# Patient Record
Sex: Female | Born: 1975 | Race: Black or African American | Hispanic: No | Marital: Single | Smoking: Never smoker
Health system: Southern US, Community
[De-identification: ages and names within clinical notes are randomized; demographics above are authoritative.]

## PROBLEM LIST (undated history)

## (undated) DIAGNOSIS — Z9049 Acquired absence of other specified parts of digestive tract: Secondary | ICD-10-CM

## (undated) DIAGNOSIS — Z9884 Bariatric surgery status: Secondary | ICD-10-CM

## (undated) HISTORY — DX: Acquired absence of other specified parts of digestive tract: Z90.49

## (undated) HISTORY — PX: OTHER SURGICAL HISTORY: SHX169

---

## 2001-05-21 ENCOUNTER — Inpatient Hospital Stay (HOSPITAL_COMMUNITY): Admission: EM | Admit: 2001-05-21 | Discharge: 2001-05-22 | Payer: Self-pay | Admitting: *Deleted

## 2006-08-17 ENCOUNTER — Ambulatory Visit: Payer: Self-pay | Admitting: Family Medicine

## 2007-02-01 ENCOUNTER — Ambulatory Visit: Payer: Self-pay | Admitting: Family Medicine

## 2007-02-01 ENCOUNTER — Other Ambulatory Visit: Admission: RE | Admit: 2007-02-01 | Discharge: 2007-02-01 | Payer: Self-pay | Admitting: Family Medicine

## 2007-02-25 ENCOUNTER — Emergency Department (HOSPITAL_COMMUNITY): Admission: EM | Admit: 2007-02-25 | Discharge: 2007-02-25 | Payer: Self-pay | Admitting: Emergency Medicine

## 2008-12-27 DIAGNOSIS — Z9884 Bariatric surgery status: Secondary | ICD-10-CM

## 2008-12-27 HISTORY — DX: Bariatric surgery status: Z98.84

## 2011-01-26 ENCOUNTER — Other Ambulatory Visit: Payer: Self-pay | Admitting: Orthopaedic Surgery

## 2011-01-26 DIAGNOSIS — M25462 Effusion, left knee: Secondary | ICD-10-CM

## 2011-01-26 DIAGNOSIS — M25562 Pain in left knee: Secondary | ICD-10-CM

## 2011-02-01 ENCOUNTER — Other Ambulatory Visit: Payer: Self-pay

## 2011-02-01 ENCOUNTER — Inpatient Hospital Stay: Admission: RE | Admit: 2011-02-01 | Payer: Self-pay | Source: Ambulatory Visit

## 2011-03-30 ENCOUNTER — Ambulatory Visit: Payer: Self-pay | Admitting: *Deleted

## 2011-03-30 ENCOUNTER — Encounter: Payer: 59 | Attending: Family Medicine | Admitting: *Deleted

## 2011-03-30 DIAGNOSIS — Z713 Dietary counseling and surveillance: Secondary | ICD-10-CM | POA: Insufficient documentation

## 2011-03-30 DIAGNOSIS — R7309 Other abnormal glucose: Secondary | ICD-10-CM | POA: Insufficient documentation

## 2011-09-01 ENCOUNTER — Emergency Department (HOSPITAL_COMMUNITY)
Admission: EM | Admit: 2011-09-01 | Discharge: 2011-09-01 | Disposition: A | Discharge status: Home / Self Care | Payer: 59 | Attending: Emergency Medicine | Admitting: Emergency Medicine

## 2011-09-01 DIAGNOSIS — R079 Chest pain, unspecified: Secondary | ICD-10-CM | POA: Insufficient documentation

## 2011-09-01 LAB — POCT I-STAT TROPONIN I: Troponin i, poc: 0 ng/mL (ref 0.00–0.08)

## 2013-12-27 DIAGNOSIS — Z9049 Acquired absence of other specified parts of digestive tract: Secondary | ICD-10-CM

## 2013-12-27 HISTORY — DX: Acquired absence of other specified parts of digestive tract: Z90.49

## 2014-11-09 ENCOUNTER — Encounter (HOSPITAL_COMMUNITY): Payer: Self-pay | Admitting: Emergency Medicine

## 2014-11-09 ENCOUNTER — Emergency Department (HOSPITAL_COMMUNITY)
Admission: EM | Admit: 2014-11-09 | Discharge: 2014-11-10 | Discharge status: Against Medical Advice | Payer: 59 | Source: Home / Self Care

## 2014-11-09 DIAGNOSIS — R1011 Right upper quadrant pain: Secondary | ICD-10-CM | POA: Diagnosis not present

## 2014-11-09 DIAGNOSIS — K8000 Calculus of gallbladder with acute cholecystitis without obstruction: Principal | ICD-10-CM | POA: Diagnosis present

## 2014-11-09 DIAGNOSIS — Z6831 Body mass index (BMI) 31.0-31.9, adult: Secondary | ICD-10-CM

## 2014-11-09 DIAGNOSIS — R109 Unspecified abdominal pain: Secondary | ICD-10-CM | POA: Insufficient documentation

## 2014-11-09 DIAGNOSIS — E669 Obesity, unspecified: Secondary | ICD-10-CM | POA: Diagnosis present

## 2014-11-09 HISTORY — DX: Bariatric surgery status: Z98.84

## 2014-11-09 LAB — CBC WITH DIFFERENTIAL/PLATELET
BASOS PCT: 0 % (ref 0–1)
Basophils Absolute: 0 10*3/uL (ref 0.0–0.1)
EOS ABS: 0 10*3/uL (ref 0.0–0.7)
Eosinophils Relative: 1 % (ref 0–5)
HCT: 39.5 % (ref 36.0–46.0)
HEMOGLOBIN: 13.8 g/dL (ref 12.0–15.0)
Lymphocytes Relative: 32 % (ref 12–46)
Lymphs Abs: 2.3 10*3/uL (ref 0.7–4.0)
MCH: 30.4 pg (ref 26.0–34.0)
MCHC: 34.9 g/dL (ref 30.0–36.0)
MCV: 87 fL (ref 78.0–100.0)
Monocytes Absolute: 0.5 10*3/uL (ref 0.1–1.0)
Monocytes Relative: 7 % (ref 3–12)
NEUTROS PCT: 60 % (ref 43–77)
Neutro Abs: 4.3 10*3/uL (ref 1.7–7.7)
Platelets: 340 10*3/uL (ref 150–400)
RBC: 4.54 MIL/uL (ref 3.87–5.11)
RDW: 12.4 % (ref 11.5–15.5)
WBC: 7.2 10*3/uL (ref 4.0–10.5)

## 2014-11-09 NOTE — ED Notes (Signed)
The patient arrived to the ED with a complaint of abdominal pain.  Pt has gone to the doctor where a CT scan was scheduled.  Pt was given medication but it didn't relieve the pain.  Pt states that she did get relief when she stopped eating.

## 2014-11-10 ENCOUNTER — Encounter (HOSPITAL_COMMUNITY): Admission: EM | Disposition: A | Discharge status: Home / Self Care | Payer: Self-pay | Source: Home / Self Care

## 2014-11-10 ENCOUNTER — Inpatient Hospital Stay (HOSPITAL_COMMUNITY): Payer: 59 | Admitting: Anesthesiology

## 2014-11-10 ENCOUNTER — Emergency Department (HOSPITAL_COMMUNITY): Payer: 59

## 2014-11-10 ENCOUNTER — Inpatient Hospital Stay (HOSPITAL_COMMUNITY)
Admission: EM | Admit: 2014-11-10 | Discharge: 2014-11-11 | DRG: 419 | Disposition: A | Discharge status: Home / Self Care | Payer: 59 | Attending: Surgery | Admitting: Surgery

## 2014-11-10 ENCOUNTER — Encounter (HOSPITAL_COMMUNITY): Payer: Self-pay | Admitting: Emergency Medicine

## 2014-11-10 ENCOUNTER — Inpatient Hospital Stay (HOSPITAL_COMMUNITY): Payer: 59

## 2014-11-10 DIAGNOSIS — E669 Obesity, unspecified: Secondary | ICD-10-CM | POA: Diagnosis present

## 2014-11-10 DIAGNOSIS — R1011 Right upper quadrant pain: Secondary | ICD-10-CM | POA: Diagnosis present

## 2014-11-10 DIAGNOSIS — R1010 Upper abdominal pain, unspecified: Secondary | ICD-10-CM

## 2014-11-10 DIAGNOSIS — K8050 Calculus of bile duct without cholangitis or cholecystitis without obstruction: Secondary | ICD-10-CM

## 2014-11-10 DIAGNOSIS — K8000 Calculus of gallbladder with acute cholecystitis without obstruction: Secondary | ICD-10-CM | POA: Diagnosis present

## 2014-11-10 DIAGNOSIS — K8020 Calculus of gallbladder without cholecystitis without obstruction: Secondary | ICD-10-CM

## 2014-11-10 DIAGNOSIS — K810 Acute cholecystitis: Secondary | ICD-10-CM

## 2014-11-10 DIAGNOSIS — Z6831 Body mass index (BMI) 31.0-31.9, adult: Secondary | ICD-10-CM | POA: Diagnosis not present

## 2014-11-10 HISTORY — PX: CHOLECYSTECTOMY: SHX55

## 2014-11-10 LAB — CBC WITH DIFFERENTIAL/PLATELET
BASOS ABS: 0 10*3/uL (ref 0.0–0.1)
Basophils Relative: 0 % (ref 0–1)
EOS ABS: 0 10*3/uL (ref 0.0–0.7)
EOS PCT: 0 % (ref 0–5)
HCT: 38.5 % (ref 36.0–46.0)
Hemoglobin: 13.1 g/dL (ref 12.0–15.0)
LYMPHS PCT: 13 % (ref 12–46)
Lymphs Abs: 1.2 10*3/uL (ref 0.7–4.0)
MCH: 31 pg (ref 26.0–34.0)
MCHC: 34 g/dL (ref 30.0–36.0)
MCV: 91.2 fL (ref 78.0–100.0)
MONO ABS: 0.4 10*3/uL (ref 0.1–1.0)
Monocytes Relative: 5 % (ref 3–12)
Neutro Abs: 7.1 10*3/uL (ref 1.7–7.7)
Neutrophils Relative %: 82 % — ABNORMAL HIGH (ref 43–77)
PLATELETS: 343 10*3/uL (ref 150–400)
RBC: 4.22 MIL/uL (ref 3.87–5.11)
RDW: 12.5 % (ref 11.5–15.5)
WBC: 8.7 10*3/uL (ref 4.0–10.5)

## 2014-11-10 LAB — URINALYSIS, ROUTINE W REFLEX MICROSCOPIC
Bilirubin Urine: NEGATIVE
Glucose, UA: NEGATIVE mg/dL
Hgb urine dipstick: NEGATIVE
KETONES UR: NEGATIVE mg/dL
LEUKOCYTES UA: NEGATIVE
Nitrite: NEGATIVE
PROTEIN: 30 mg/dL — AB
Specific Gravity, Urine: 1.022 (ref 1.005–1.030)
Urobilinogen, UA: 0.2 mg/dL (ref 0.0–1.0)
pH: 7 (ref 5.0–8.0)

## 2014-11-10 LAB — COMPREHENSIVE METABOLIC PANEL
ALT: 11 U/L (ref 0–35)
AST: 18 U/L (ref 0–37)
Albumin: 3.7 g/dL (ref 3.5–5.2)
Alkaline Phosphatase: 64 U/L (ref 39–117)
Anion gap: 14 (ref 5–15)
BUN: 9 mg/dL (ref 6–23)
CO2: 22 meq/L (ref 19–32)
CREATININE: 0.75 mg/dL (ref 0.50–1.10)
Calcium: 9.5 mg/dL (ref 8.4–10.5)
Chloride: 101 mEq/L (ref 96–112)
Glucose, Bld: 127 mg/dL — ABNORMAL HIGH (ref 70–99)
Potassium: 4.4 mEq/L (ref 3.7–5.3)
Sodium: 137 mEq/L (ref 137–147)
TOTAL PROTEIN: 7.3 g/dL (ref 6.0–8.3)
Total Bilirubin: 0.2 mg/dL — ABNORMAL LOW (ref 0.3–1.2)

## 2014-11-10 LAB — LIPASE, BLOOD
Lipase: 24 U/L (ref 11–59)
Lipase: 21 U/L (ref 11–59)

## 2014-11-10 LAB — BASIC METABOLIC PANEL
Anion gap: 13 (ref 5–15)
BUN: 9 mg/dL (ref 6–23)
CALCIUM: 9.5 mg/dL (ref 8.4–10.5)
CO2: 26 mEq/L (ref 19–32)
CREATININE: 0.77 mg/dL (ref 0.50–1.10)
Chloride: 103 mEq/L (ref 96–112)
GLUCOSE: 113 mg/dL — AB (ref 70–99)
Potassium: 4.6 mEq/L (ref 3.7–5.3)
Sodium: 142 mEq/L (ref 137–147)

## 2014-11-10 LAB — SURGICAL PCR SCREEN
MRSA, PCR: NEGATIVE
STAPHYLOCOCCUS AUREUS: NEGATIVE

## 2014-11-10 LAB — URINE MICROSCOPIC-ADD ON

## 2014-11-10 LAB — PREGNANCY, URINE: Preg Test, Ur: NEGATIVE

## 2014-11-10 SURGERY — LAPAROSCOPIC CHOLECYSTECTOMY WITH INTRAOPERATIVE CHOLANGIOGRAM
Anesthesia: General | Site: Abdomen

## 2014-11-10 MED ORDER — MORPHINE SULFATE 4 MG/ML IJ SOLN
4.0000 mg | INTRAMUSCULAR | Status: AC | PRN
  Administered 2014-11-10 (×2): 4 mg via INTRAVENOUS
  Filled 2014-11-10 (×2): qty 1

## 2014-11-10 MED ORDER — LIDOCAINE HCL (CARDIAC) 20 MG/ML IV SOLN
INTRAVENOUS | Status: DC | PRN
  Administered 2014-11-10: 50 mg via INTRAVENOUS

## 2014-11-10 MED ORDER — FENTANYL CITRATE 0.05 MG/ML IJ SOLN
INTRAMUSCULAR | Status: DC | PRN
  Administered 2014-11-10 (×3): 50 ug via INTRAVENOUS
  Administered 2014-11-10: 100 ug via INTRAVENOUS

## 2014-11-10 MED ORDER — BUPIVACAINE-EPINEPHRINE 0.25% -1:200000 IJ SOLN
INTRAMUSCULAR | Status: DC | PRN
  Administered 2014-11-10: 5 mL

## 2014-11-10 MED ORDER — OXYCODONE HCL 5 MG/5ML PO SOLN: 5.0000 mg | Freq: Once | ORAL | Status: AC | PRN

## 2014-11-10 MED ORDER — FAMOTIDINE IN NACL 20-0.9 MG/50ML-% IV SOLN
20.0000 mg | Freq: Once | INTRAVENOUS | Status: AC
  Administered 2014-11-10: 20 mg via INTRAVENOUS
  Filled 2014-11-10: qty 50

## 2014-11-10 MED ORDER — FENTANYL CITRATE 0.05 MG/ML IJ SOLN
50.0000 ug | INTRAMUSCULAR | Status: DC | PRN
  Administered 2014-11-10: 50 ug via INTRAVENOUS
  Filled 2014-11-10: qty 2

## 2014-11-10 MED ORDER — ROCURONIUM BROMIDE 100 MG/10ML IV SOLN
INTRAVENOUS | Status: DC | PRN
  Administered 2014-11-10: 30 mg via INTRAVENOUS
  Administered 2014-11-10: 5 mg via INTRAVENOUS

## 2014-11-10 MED ORDER — ARTIFICIAL TEARS OP OINT
TOPICAL_OINTMENT | OPHTHALMIC | Status: DC | PRN
  Administered 2014-11-10: 1 via OPHTHALMIC

## 2014-11-10 MED ORDER — FENTANYL CITRATE 0.05 MG/ML IJ SOLN: 50.0000 ug | Freq: Once | INTRAMUSCULAR | Status: DC

## 2014-11-10 MED ORDER — HYDROMORPHONE HCL 1 MG/ML IJ SOLN
INTRAMUSCULAR | Status: AC
  Filled 2014-11-10: qty 1

## 2014-11-10 MED ORDER — OXYCODONE HCL 5 MG PO TABS: 5.0000 mg | ORAL_TABLET | Freq: Once | ORAL | Status: AC | PRN

## 2014-11-10 MED ORDER — HYDROMORPHONE HCL 1 MG/ML IJ SOLN
1.0000 mg | INTRAMUSCULAR | Status: DC | PRN
  Administered 2014-11-10 – 2014-11-11 (×4): 1 mg via INTRAVENOUS
  Filled 2014-11-10 (×4): qty 1

## 2014-11-10 MED ORDER — NEOSTIGMINE METHYLSULFATE 10 MG/10ML IV SOLN
INTRAVENOUS | Status: DC | PRN
  Administered 2014-11-10: 2 mg via INTRAVENOUS

## 2014-11-10 MED ORDER — MIDAZOLAM HCL 5 MG/5ML IJ SOLN
INTRAMUSCULAR | Status: DC | PRN
  Administered 2014-11-10: 2 mg via INTRAVENOUS

## 2014-11-10 MED ORDER — ONDANSETRON HCL 4 MG/2ML IJ SOLN
4.0000 mg | Freq: Four times a day (QID) | INTRAMUSCULAR | Status: DC | PRN
  Administered 2014-11-10: 4 mg via INTRAVENOUS
  Filled 2014-11-10: qty 2

## 2014-11-10 MED ORDER — BUPIVACAINE-EPINEPHRINE (PF) 0.25% -1:200000 IJ SOLN
INTRAMUSCULAR | Status: AC
  Filled 2014-11-10: qty 30

## 2014-11-10 MED ORDER — MIDAZOLAM HCL 2 MG/2ML IJ SOLN
INTRAMUSCULAR | Status: AC
  Filled 2014-11-10: qty 2

## 2014-11-10 MED ORDER — DICYCLOMINE HCL 10 MG PO CAPS
20.0000 mg | ORAL_CAPSULE | Freq: Once | ORAL | Status: AC
  Administered 2014-11-10: 20 mg via ORAL
  Filled 2014-11-10: qty 2

## 2014-11-10 MED ORDER — WHITE PETROLATUM GEL
Status: AC
  Administered 2014-11-11: 03:00:00
  Filled 2014-11-10: qty 5

## 2014-11-10 MED ORDER — ACETAMINOPHEN 325 MG PO TABS: 325.0000 mg | ORAL_TABLET | ORAL | Status: DC | PRN

## 2014-11-10 MED ORDER — ONDANSETRON HCL 4 MG/2ML IJ SOLN
INTRAMUSCULAR | Status: DC | PRN
  Administered 2014-11-10: 4 mg via INTRAVENOUS

## 2014-11-10 MED ORDER — SUCCINYLCHOLINE CHLORIDE 20 MG/ML IJ SOLN
INTRAMUSCULAR | Status: DC | PRN
  Administered 2014-11-10: 100 mg via INTRAVENOUS

## 2014-11-10 MED ORDER — ACETAMINOPHEN 160 MG/5ML PO SOLN: 325.0000 mg | ORAL | Status: DC | PRN

## 2014-11-10 MED ORDER — PROPOFOL 10 MG/ML IV BOLUS
INTRAVENOUS | Status: DC | PRN
  Administered 2014-11-10: 200 mg via INTRAVENOUS

## 2014-11-10 MED ORDER — LIDOCAINE HCL 4 % MT SOLN
OROMUCOSAL | Status: DC | PRN
  Administered 2014-11-10: 2 mL via TOPICAL

## 2014-11-10 MED ORDER — HYDROMORPHONE HCL 1 MG/ML IJ SOLN
0.2500 mg | INTRAMUSCULAR | Status: DC | PRN
  Administered 2014-11-10 (×2): 0.5 mg via INTRAVENOUS

## 2014-11-10 MED ORDER — CIPROFLOXACIN IN D5W 400 MG/200ML IV SOLN
400.0000 mg | Freq: Two times a day (BID) | INTRAVENOUS | Status: DC
  Administered 2014-11-10 – 2014-11-11 (×3): 400 mg via INTRAVENOUS
  Filled 2014-11-10 (×5): qty 200

## 2014-11-10 MED ORDER — DEXAMETHASONE SODIUM PHOSPHATE 4 MG/ML IJ SOLN
INTRAMUSCULAR | Status: AC
  Filled 2014-11-10: qty 2

## 2014-11-10 MED ORDER — SODIUM CHLORIDE 0.9 % IV SOLN
INTRAVENOUS | Status: DC | PRN
  Administered 2014-11-10: 50 mL

## 2014-11-10 MED ORDER — SODIUM CHLORIDE 0.9 % IR SOLN
Status: DC | PRN
  Administered 2014-11-10: 1000 mL

## 2014-11-10 MED ORDER — 0.9 % SODIUM CHLORIDE (POUR BTL) OPTIME
TOPICAL | Status: DC | PRN
  Administered 2014-11-10: 1000 mL

## 2014-11-10 MED ORDER — GLYCOPYRROLATE 0.2 MG/ML IJ SOLN
INTRAMUSCULAR | Status: DC | PRN
  Administered 2014-11-10: 0.4 mg via INTRAVENOUS

## 2014-11-10 MED ORDER — KCL IN DEXTROSE-NACL 20-5-0.9 MEQ/L-%-% IV SOLN
INTRAVENOUS | Status: DC
  Administered 2014-11-10 – 2014-11-11 (×2): via INTRAVENOUS
  Filled 2014-11-10 (×4): qty 1000

## 2014-11-10 MED ORDER — INFLUENZA VAC SPLIT QUAD 0.5 ML IM SUSY
0.5000 mL | PREFILLED_SYRINGE | INTRAMUSCULAR | Status: DC
  Filled 2014-11-10: qty 0.5

## 2014-11-10 MED ORDER — ONDANSETRON HCL 4 MG/2ML IJ SOLN
4.0000 mg | INTRAMUSCULAR | Status: AC | PRN
  Administered 2014-11-10 (×2): 4 mg via INTRAVENOUS
  Filled 2014-11-10 (×3): qty 2

## 2014-11-10 MED ORDER — LACTATED RINGERS IV SOLN
INTRAVENOUS | Status: DC | PRN
  Administered 2014-11-10 (×2): via INTRAVENOUS

## 2014-11-10 MED ORDER — ENOXAPARIN SODIUM 40 MG/0.4ML ~~LOC~~ SOLN
40.0000 mg | SUBCUTANEOUS | Status: DC
  Administered 2014-11-10: 40 mg via SUBCUTANEOUS
  Filled 2014-11-10 (×2): qty 0.4

## 2014-11-10 MED ORDER — FENTANYL CITRATE 0.05 MG/ML IJ SOLN
INTRAMUSCULAR | Status: AC
  Filled 2014-11-10: qty 5

## 2014-11-10 MED ORDER — DEXAMETHASONE SODIUM PHOSPHATE 4 MG/ML IJ SOLN
INTRAMUSCULAR | Status: DC | PRN
  Administered 2014-11-10: 8 mg via INTRAVENOUS

## 2014-11-10 SURGICAL SUPPLY — 44 items
ADH SKN CLS APL DERMABOND .7 (GAUZE/BANDAGES/DRESSINGS) ×1
APPLIER CLIP ROT 10 11.4 M/L (STAPLE) ×4
APR CLP MED LRG 11.4X10 (STAPLE) ×2
BAG SPEC RTRVL LRG 6X4 10 (ENDOMECHANICALS) ×1
BLADE SURG ROTATE 9660 (MISCELLANEOUS) IMPLANT
CANISTER SUCTION 2500CC (MISCELLANEOUS) ×2 IMPLANT
CHLORAPREP W/TINT 26ML (MISCELLANEOUS) ×2 IMPLANT
CLIP APPLIE ROT 10 11.4 M/L (STAPLE) ×1 IMPLANT
COVER MAYO STAND STRL (DRAPES) ×2 IMPLANT
COVER SURGICAL LIGHT HANDLE (MISCELLANEOUS) ×2 IMPLANT
DECANTER SPIKE VIAL GLASS SM (MISCELLANEOUS) ×4 IMPLANT
DERMABOND ADVANCED (GAUZE/BANDAGES/DRESSINGS) ×1
DERMABOND ADVANCED .7 DNX12 (GAUZE/BANDAGES/DRESSINGS) ×1 IMPLANT
DRAPE C-ARM 42X72 X-RAY (DRAPES) ×2 IMPLANT
DRAPE LAPAROSCOPIC ABDOMINAL (DRAPES) ×2 IMPLANT
DRAPE UTILITY 15X26 W/TAPE STR (DRAPE) ×4 IMPLANT
DRAPE WARM FLUID 44X44 (DRAPE) ×2 IMPLANT
ELECT REM PT RETURN 9FT ADLT (ELECTROSURGICAL) ×2
ELECTRODE REM PT RTRN 9FT ADLT (ELECTROSURGICAL) ×1 IMPLANT
GLOVE BIO SURGEON STRL SZ8 (GLOVE) ×2 IMPLANT
GLOVE BIOGEL PI IND STRL 8 (GLOVE) ×1 IMPLANT
GLOVE BIOGEL PI INDICATOR 8 (GLOVE) ×1
GOWN STRL REUS W/ TWL LRG LVL3 (GOWN DISPOSABLE) ×4 IMPLANT
GOWN STRL REUS W/ TWL XL LVL3 (GOWN DISPOSABLE) ×1 IMPLANT
GOWN STRL REUS W/TWL LRG LVL3 (GOWN DISPOSABLE) ×8
GOWN STRL REUS W/TWL XL LVL3 (GOWN DISPOSABLE) ×2
KIT BASIN OR (CUSTOM PROCEDURE TRAY) ×2 IMPLANT
KIT ROOM TURNOVER OR (KITS) ×2 IMPLANT
NS IRRIG 1000ML POUR BTL (IV SOLUTION) ×4 IMPLANT
PAD ARMBOARD 7.5X6 YLW CONV (MISCELLANEOUS) ×2 IMPLANT
POUCH SPECIMEN RETRIEVAL 10MM (ENDOMECHANICALS) ×2 IMPLANT
SCISSORS LAP 5X35 DISP (ENDOMECHANICALS) ×2 IMPLANT
SET CHOLANGIOGRAPH 5 50 .035 (SET/KITS/TRAYS/PACK) ×2 IMPLANT
SET IRRIG TUBING LAPAROSCOPIC (IRRIGATION / IRRIGATOR) ×2 IMPLANT
SLEEVE ENDOPATH XCEL 5M (ENDOMECHANICALS) ×2 IMPLANT
SPECIMEN JAR SMALL (MISCELLANEOUS) ×2 IMPLANT
SUT MNCRL AB 4-0 PS2 18 (SUTURE) ×2 IMPLANT
TOWEL OR 17X24 6PK STRL BLUE (TOWEL DISPOSABLE) ×2 IMPLANT
TOWEL OR 17X26 10 PK STRL BLUE (TOWEL DISPOSABLE) ×2 IMPLANT
TRAY LAPAROSCOPIC (CUSTOM PROCEDURE TRAY) ×2 IMPLANT
TROCAR XCEL BLUNT TIP 100MML (ENDOMECHANICALS) ×2 IMPLANT
TROCAR XCEL NON-BLD 11X100MML (ENDOMECHANICALS) ×2 IMPLANT
TROCAR XCEL NON-BLD 5MMX100MML (ENDOMECHANICALS) ×2 IMPLANT
TUBING INSUFFLATION (TUBING) ×2 IMPLANT

## 2014-11-10 NOTE — Plan of Care (Signed)
Problem: Phase I Progression Outcomes Goal: Tubes/drains patent Outcome: Not Applicable Date Met:  11/10/14     

## 2014-11-10 NOTE — Anesthesia Postprocedure Evaluation (Signed)
  Anesthesia Post-op Note  Patient: Sarah Beck  Procedure(s) Performed: Procedure(s): LAPAROSCOPIC CHOLECYSTECTOMY WITH INTRAOPERATIVE CHOLANGIOGRAM (N/A)  Patient Location: PACU  Anesthesia Type:General  Level of Consciousness: awake  Airway and Oxygen Therapy: Patient Spontanous Breathing  Post-op Pain: mild  Post-op Assessment: Post-op Vital signs reviewed, Patient's Cardiovascular Status Stable, Respiratory Function Stable, Patent Airway, No signs of Nausea or vomiting and Pain level controlled  Post-op Vital Signs: Reviewed and stable  Last Vitals:  Filed Vitals:   11/10/14 1639  BP: 117/63  Pulse: 75  Temp: 36.2 C  Resp: 18    Complications: No apparent anesthesia complications

## 2014-11-10 NOTE — ED Notes (Signed)
Pt. Informed of need for urine. States she can not go at this time.

## 2014-11-10 NOTE — Op Note (Signed)
Laparoscopic Cholecystectomy with IOC Procedure Note  Indications: This patient presents with symptomatic gallbladder disease and will undergo laparoscopic cholecystectomy.The procedure has been discussed with the patient. Operative and non operative treatments have been discussed. Risks of surgery include bleeding, infection,  Common bile duct injury,  Injury to the stomach,liver, colon,small intestine, abdominal wall,  Diaphragm,  Major blood vessels,  And the need for an open procedure.  Other risks include worsening of medical problems, death,  DVT and pulmonary embolism, and cardiovascular events.   Medical options have also been discussed. The patient has been informed of long term expectations of surgery and non surgical options,  The patient agrees to proceed.    Pre-operative Diagnosis: Calculus of gallbladder with acute cholecystitis, without mention of obstruction  Post-operative Diagnosis: Same  Surgeon: [place] A.   Assistants: OR staff  Anesthesia: General endotracheal anesthesia and Local anesthesia 0.25.% bupivacaine, with epinephrine  ASA Class: 2  Procedure Details  The patient was seen again in the Holding Room. The risks, benefits, complications, treatment options, and expected outcomes were discussed with the patient. The possibilities of reaction to medication, pulmonary aspiration, perforation of viscus, bleeding, recurrent infection, finding a normal gallbladder, the need for additional procedures, failure to diagnose a condition, the possible need to convert to an open procedure, and creating a complication requiring transfusion or operation were discussed with the patient. The patient and/or family concurred with the proposed plan, giving informed consent. The site of surgery properly noted/marked. The patient was taken to Operating Room, identified as Teddy SpikeLekisha Okeefe and the procedure verified as Laparoscopic Cholecystectomy with Intraoperative Cholangiograms. A Time  Out was held and the above information confirmed.  Prior to the induction of general anesthesia, antibiotic prophylaxis was administered. General endotracheal anesthesia was then administered and tolerated well. After the induction, the abdomen was prepped in the usual sterile fashion. The patient was positioned in the supine position with the left arm comfortably tucked, along with some reverse Trendelenburg.  Local anesthetic agent was injected into the skin near the umbilicus and an incision made. The midline fascia was incised and the Hasson technique was used to introduce a 12 mm port under direct vision. It was secured with a figure of eight Vicryl suture placed in the usual fashion. Pneumoperitoneum was then created with CO2 and tolerated well without any adverse changes in the patient's vital signs. Additional trocars were introduced under direct vision with an 11 mm trocar in the epigastrium and two  5 mm trocars in the right upper quadrant. All skin incisions were infiltrated with a local anesthetic agent before making the incision and placing the trocars.   The gallbladder was identified, the fundus grasped and retracted cephalad. Adhesions were lysed bluntly and with the electrocautery where indicated, taking care not to injure any adjacent organs or viscus. The infundibulum was grasped and retracted laterally, exposing the peritoneum overlying the triangle of Calot. This was then divided and exposed in a blunt fashion. The cystic duct was clearly identified and bluntly dissected circumferentially. The junctions of the gallbladder, cystic duct and common bile duct were clearly identified prior to the division of any linear structure.   An incision was made in the cystic duct and the cholangiogram catheter introduced. The catheter was secured using an endoclip. The study showed no stones and good visualization of the distal and proximal biliary tree. The catheter was then removed.   The cystic  duct was then  ligated with surgical clips  on the patient side  and  clipped on the gallbladder side and divided. The cystic artery was identified, dissected free, ligated with clips and divided as well. Posterior cystic artery clipped and divided.  The gallbladder was dissected from the liver bed in retrograde fashion with the electrocautery. The gallbladder was removed with endocatch bag . The liver bed was irrigated and inspected. Hemostasis was achieved with the electrocautery. Copious irrigation was utilized and was repeatedly aspirated until clear all particulate matter. Hemostasis was achieved with no signs  Of bleeding or bile leakage.  Pneumoperitoneum was completely reduced after viewing removal of the trocars under direct vision. The wound was thoroughly irrigated and the fascia was then closed with a figure of eight suture; the skin was then closed with 4 O monocryl and a sterile dressing was applied of adhesive.   Instrument, sponge, and needle counts were correct at closure and at the conclusion of the case.   Findings: Cholecystitis with Cholelithiasis  Estimated Blood Loss: Minimal         Drains: none         Total IV Fluids:  600 mL         Specimens: Gallbladder           Complications: None; patient tolerated the procedure well.         Disposition: PACU - hemodynamically stable.         Condition: stable

## 2014-11-10 NOTE — Anesthesia Preprocedure Evaluation (Addendum)
Anesthesia Evaluation  Patient identified by MRN, date of birth, ID band Patient awake    Reviewed: Allergy & Precautions, H&P , NPO status , Patient's Chart, lab work & pertinent test results  History of Anesthesia Complications Negative for: history of anesthetic complications  Airway Mallampati: I  TM Distance: >3 FB Neck ROM: Full    Dental  (+) Teeth Intact, Dental Advisory Given   Pulmonary neg pulmonary ROS,  breath sounds clear to auscultation        Cardiovascular negative cardio ROS  Rhythm:Regular     Neuro/Psych negative neurological ROS     GI/Hepatic Neg liver ROS, gallstones   Endo/Other  Morbid obesity  Renal/GU negative Renal ROS     Musculoskeletal   Abdominal   Peds  Hematology negative hematology ROS (+)   Anesthesia Other Findings   Reproductive/Obstetrics                          Anesthesia Physical Anesthesia Plan  ASA: II  Anesthesia Plan: General   Post-op Pain Management:    Induction: Intravenous, Rapid sequence and Cricoid pressure planned  Airway Management Planned: Oral ETT  Additional Equipment: None  Intra-op Plan:   Post-operative Plan: Extubation in OR  Informed Consent: I have reviewed the patients History and Physical, chart, labs and discussed the procedure including the risks, benefits and alternatives for the proposed anesthesia with the patient or authorized representative who has indicated his/her understanding and acceptance.   Dental advisory given  Plan Discussed with: CRNA and Surgeon  Anesthesia Plan Comments:         Anesthesia Quick Evaluation

## 2014-11-10 NOTE — ED Notes (Signed)
Patient transported to CT 

## 2014-11-10 NOTE — H&P (Signed)
Sarah Beck is an 38 y.o. female.   Chief Complaint: RUQ abdominal pain HPI: Pt presents to ED with 12 hour hx RUQ abdominal pain  Sharp severe and has not improved.  Has been given pain meds and this helps but pain returns once pain medicine wears off it returns.   No vomiting.  Has lap band.    Past Medical History  Diagnosis Date  . LAP-BAND surgery status 2010    History reviewed. No pertinent past surgical history.  No family history on file. Social History:  reports that she has never smoked. She has never used smokeless tobacco. She reports that she does not drink alcohol or use illicit drugs.  Allergies: No Known Allergies   (Not in a hospital admission)  Results for orders placed or performed during the hospital encounter of 11/10/14 (from the past 48 hour(s))  CBC with Differential     Status: Abnormal   Collection Time: 11/10/14  2:31 AM  Result Value Ref Range   WBC 8.7 4.0 - 10.5 K/uL   RBC 4.22 3.87 - 5.11 MIL/uL   Hemoglobin 13.1 12.0 - 15.0 g/dL   HCT 38.5 36.0 - 46.0 %   MCV 91.2 78.0 - 100.0 fL   MCH 31.0 26.0 - 34.0 pg   MCHC 34.0 30.0 - 36.0 g/dL   RDW 12.5 11.5 - 15.5 %   Platelets 343 150 - 400 K/uL   Neutrophils Relative % 82 (H) 43 - 77 %   Neutro Abs 7.1 1.7 - 7.7 K/uL   Lymphocytes Relative 13 12 - 46 %   Lymphs Abs 1.2 0.7 - 4.0 K/uL   Monocytes Relative 5 3 - 12 %   Monocytes Absolute 0.4 0.1 - 1.0 K/uL   Eosinophils Relative 0 0 - 5 %   Eosinophils Absolute 0.0 0.0 - 0.7 K/uL   Basophils Relative 0 0 - 1 %   Basophils Absolute 0.0 0.0 - 0.1 K/uL  Comprehensive metabolic panel     Status: Abnormal   Collection Time: 11/10/14  2:31 AM  Result Value Ref Range   Sodium 137 137 - 147 mEq/L   Potassium 4.4 3.7 - 5.3 mEq/L   Chloride 101 96 - 112 mEq/L   CO2 22 19 - 32 mEq/L   Glucose, Bld 127 (H) 70 - 99 mg/dL   BUN 9 6 - 23 mg/dL   Creatinine, Ser 0.75 0.50 - 1.10 mg/dL   Calcium 9.5 8.4 - 10.5 mg/dL   Total Protein 7.3 6.0 - 8.3 g/dL   Albumin 3.7 3.5 - 5.2 g/dL   AST 18 0 - 37 U/L   ALT 11 0 - 35 U/L   Alkaline Phosphatase 64 39 - 117 U/L   Total Bilirubin 0.2 (L) 0.3 - 1.2 mg/dL   GFR calc non Af Amer >90 >90 mL/min   GFR calc Af Amer >90 >90 mL/min    Comment: (NOTE) The eGFR has been calculated using the CKD EPI equation. This calculation has not been validated in all clinical situations. eGFR's persistently <90 mL/min signify possible Chronic Kidney Disease.    Anion gap 14 5 - 15  Lipase, blood     Status: None   Collection Time: 11/10/14  2:32 AM  Result Value Ref Range   Lipase 21 11 - 59 U/L  Pregnancy, urine     Status: None   Collection Time: 11/10/14  3:24 AM  Result Value Ref Range   Preg Test, Ur NEGATIVE NEGATIVE  Comment:        THE SENSITIVITY OF THIS METHODOLOGY IS >20 mIU/mL.   Urinalysis, Routine w reflex microscopic     Status: Abnormal   Collection Time: 11/10/14  3:25 AM  Result Value Ref Range   Color, Urine YELLOW YELLOW   APPearance CLOUDY (A) CLEAR   Specific Gravity, Urine 1.022 1.005 - 1.030   pH 7.0 5.0 - 8.0   Glucose, UA NEGATIVE NEGATIVE mg/dL   Hgb urine dipstick NEGATIVE NEGATIVE   Bilirubin Urine NEGATIVE NEGATIVE   Ketones, ur NEGATIVE NEGATIVE mg/dL   Protein, ur 30 (A) NEGATIVE mg/dL   Urobilinogen, UA 0.2 0.0 - 1.0 mg/dL   Nitrite NEGATIVE NEGATIVE   Leukocytes, UA NEGATIVE NEGATIVE  Urine microscopic-add on     Status: Abnormal   Collection Time: 11/10/14  3:25 AM  Result Value Ref Range   Squamous Epithelial / LPF RARE RARE   Bacteria, UA FEW (A) RARE   US Abdomen Complete  11/10/2014   CLINICAL DATA:  Upper abdominal pain  EXAM: ULTRASOUND ABDOMEN COMPLETE  COMPARISON:  None.  FINDINGS: Gallbladder: Contracted gallbladder. Cholelithiasis without gallbladder wall thickening or pericholecystic fluid. No sonographic Murphy sign noted.  Common bile duct: Diameter: 4.4 mm  Liver: No focal lesion identified. Within normal limits in parenchymal echogenicity.   IVC: No abnormality visualized.  Pancreas: Visualized portion unremarkable.  Spleen: Size and appearance within normal limits.  Right Kidney: Length: 10.6 cm. Echogenicity within normal limits. No mass or hydronephrosis visualized.  Left Kidney: Length: 10.6 cm. Echogenicity within normal limits. No mass or hydronephrosis visualized.  Abdominal aorta: No aneurysm visualized.  Other findings: None.  IMPRESSION: 1. Cholelithiasis without sonographic evidence of acute cholecystitis.   Electronically Signed   By: Kathreen Devoid   On: 11/10/2014 04:41   Dg Abd Acute W/chest  11/10/2014   CLINICAL DATA:  Upper abdominal pain  EXAM: ACUTE ABDOMEN SERIES (ABDOMEN 2 VIEW & CHEST 1 VIEW)  COMPARISON:  None.  FINDINGS: There is a gastric lap band present. There is a moderate amount of stool in the colon. There is no evidence of dilated bowel loops or free intraperitoneal air. No radiopaque calculi or other significant radiographic abnormality is seen. Heart size and mediastinal contours are within normal limits. Both lungs are clear.  IMPRESSION: Negative abdominal radiographs.  No acute cardiopulmonary disease.   Electronically Signed   By: Kathreen Devoid   On: 11/10/2014 06:04    Review of Systems  Constitutional: Positive for malaise/fatigue. Negative for fever and chills.  HENT: Negative.   Eyes: Negative.   Respiratory: Negative.   Cardiovascular: Negative.   Genitourinary: Negative.   Musculoskeletal: Negative.   Skin: Negative.   Neurological: Negative.   Endo/Heme/Allergies: Negative.   Psychiatric/Behavioral: Negative.     Blood pressure 128/86, pulse 79, temperature 98.4 F (36.9 C), temperature source Oral, resp. rate 20, height 5' 5"  (1.651 m), weight 190 lb (86.183 kg), last menstrual period 10/26/2014, SpO2 100 %. Physical Exam  Constitutional: She is oriented to person, place, and time. She appears well-developed and well-nourished.  HENT:  Head: Normocephalic.  Eyes: Pupils are equal,  round, and reactive to light. No scleral icterus.  Neck: Normal range of motion. Neck supple.  Cardiovascular: Normal rate and regular rhythm.   Respiratory: Effort normal and breath sounds normal.  GI: There is tenderness in the right upper quadrant. There is negative Murphy's sign.  Musculoskeletal: Normal range of motion.  Neurological: She is alert and oriented to  person, place, and time.  Skin: Skin is warm and dry.  Psychiatric: She has a normal mood and affect. Her behavior is normal. Judgment and thought content normal.     Assessment/Plan Acute cholecystitis/ biliary colic   Admit/IVF / abx/  Needs laparoscopic cholecystectomy in next 24 hours once schedule  permits.    Jailee Jaquez A. 11/10/2014, 7:55 AM

## 2014-11-10 NOTE — ED Notes (Signed)
Pt drinking ice water at the time. No active vomiting. Pt states 10/10 abdominal cramping at present. EDP notified.

## 2014-11-10 NOTE — ED Notes (Signed)
Pt came to triage desk stating that she just left [place] and came here cause they were doing nothing for her and she was in the floor crying. St's she has a history of depression and someone needs to pay attention to her.

## 2014-11-10 NOTE — Plan of Care (Signed)
Problem: Phase I Progression Outcomes Goal: Sutures/staples intact Outcome: Not Applicable Date Met:  97/67/34

## 2014-11-10 NOTE — ED Notes (Signed)
Pt refusing to sign AMA documentation. Pt overheard attempting to call ambulance to [facility] to bring her back to facility.

## 2014-11-10 NOTE — Transfer of Care (Signed)
Immediate Anesthesia Transfer of Care Note  Patient: Sarah Beck  Procedure(s) Performed: Procedure(s): LAPAROSCOPIC CHOLECYSTECTOMY WITH INTRAOPERATIVE CHOLANGIOGRAM (N/A)  Patient Location: PACU  Anesthesia Type:General  Level of Consciousness: awake  Airway & Oxygen Therapy: Patient Spontanous Breathing and Patient connected to nasal cannula oxygen  Post-op Assessment: Report given to PACU RN and Post -op Vital signs reviewed and stable  Post vital signs: Reviewed and stable  Complications: No apparent anesthesia complications

## 2014-11-10 NOTE — ED Notes (Signed)
Attempted report 

## 2014-11-10 NOTE — ED Notes (Addendum)
Pt to ED with c/o upper abd pain for several days with nausea snd vomiting.  Was seen by her MD and was told it was probably her gallbladder.

## 2014-11-10 NOTE — ED Provider Notes (Signed)
CSN: [id number]     Arrival date & time 11/10/14  0111 History   First MD Initiated Contact with Patient 11/10/14 [phone]     Chief Complaint  Patient presents with  . Abdominal Pain      HPI Pt was seen at 0300.  Per pt, c/o gradual onset and worsening of intermittent episodes of upper abd "pain" for the past 3 days, constant since yesterday. Has been associated with multiple intermittent episodes of N/V.  Describes the abd pain as "cramping," worsens with food intake (especially large meal and fatty foods) and at night. Denies diarrhea, no fevers, no back pain, no rash, no CP/SOB, no black or blood in stools or emesis.       Past Medical History  Diagnosis Date  . LAP-BAND surgery status 2010   History reviewed. No pertinent past surgical history.  History  Substance Use Topics  . Smoking status: Never Smoker   . Smokeless tobacco: Never Used  . Alcohol Use: No    Review of Systems ROS: Statement: All systems negative except as marked or noted in the HPI; Constitutional: Negative for fever and chills. ; ; Eyes: Negative for eye pain, redness and discharge. ; ; ENMT: Negative for ear pain, hoarseness, nasal congestion, sinus pressure and sore throat. ; ; Cardiovascular: Negative for chest pain, palpitations, diaphoresis, dyspnea and peripheral edema. ; ; Respiratory: Negative for cough, wheezing and stridor. ; ; Gastrointestinal: +N/V, abd pain. Negative for diarrhea, blood in stool, hematemesis, jaundice and rectal bleeding. . ; ; Genitourinary: Negative for dysuria, flank pain and hematuria. ; ; Musculoskeletal: Negative for back pain and neck pain. Negative for swelling and trauma.; ; Skin: Negative for pruritus, rash, abrasions, blisters, bruising and skin lesion.; ; Neuro: Negative for headache, lightheadedness and neck stiffness. Negative for weakness, altered level of consciousness , altered mental status, extremity weakness, paresthesias, involuntary movement, seizure and syncope.         Allergies  Review of patient's allergies indicates no known allergies.  Home Medications   Prior to Admission medications   Medication Sig Start Date End Date Taking? Authorizing Provider  acetaminophen (TYLENOL) 500 MG tablet Take 500 mg by mouth every 6 (six) hours as needed for mild pain.   Yes Historical Provider, MD  LORazepam (ATIVAN) 1 MG tablet Take 1 mg by mouth daily as needed for anxiety.   Yes Historical Provider, MD  Multiple Vitamins-Minerals (MULTIVITAMIN WITH MINERALS) tablet Take 1 tablet by mouth daily.   Yes Historical Provider, MD   BP 128/86 mmHg  Pulse 79  Temp(Src) 98.4 F (36.9 C) (Oral)  Resp 20  Ht 5\' 5"  (1.651 m)  Wt 190 lb (86.183 kg)  BMI 31.62 kg/m2  SpO2 100%  LMP 10/26/2014 Physical Exam  0305: Physical examination:  Nursing notes reviewed; Vital signs and O2 SAT reviewed;  Constitutional: Well developed, Well nourished, Well hydrated, Uncomfortable appearing.; Head:  Normocephalic, atraumatic; Eyes: EOMI, PERRL, No scleral icterus; ENMT: Mouth and pharynx normal, Mucous membranes moist; Neck: Supple, Full range of motion, No lymphadenopathy; Cardiovascular: Regular rate and rhythm, No murmur, rub, or gallop; Respiratory: Breath sounds clear & equal bilaterally, No wheezes.  Speaking full sentences with ease, Normal respiratory effort/excursion; Chest: Nontender, Movement normal; Abdomen: Soft, +RUQ and mid-epigastric areas tender to palp. No rebound or guarding. Nondistended, Normal bowel sounds; Genitourinary: No CVA tenderness; Extremities: Pulses normal, No tenderness, No edema, No calf edema or asymmetry.; Neuro: AA&Ox3, Major CN grossly intact.  Speech clear. No gross focal  motor or sensory deficits in extremities.; Skin: Color normal, Warm, Dry.   ED Course  Procedures     MDM  MDM Reviewed: previous chart, nursing note and vitals Reviewed previous: labs Interpretation: labs, ultrasound and x-ray      Results for orders placed  or performed during the hospital encounter of 11/10/14  CBC with Differential  Result Value Ref Range   WBC 8.7 4.0 - 10.5 K/uL   RBC 4.22 3.87 - 5.11 MIL/uL   Hemoglobin 13.1 12.0 - 15.0 g/dL   HCT 16.138.5 09.636.0 - 04.546.0 %   MCV 91.2 78.0 - 100.0 fL   MCH 31.0 26.0 - 34.0 pg   MCHC 34.0 30.0 - 36.0 g/dL   RDW 40.912.5 81.111.5 - 91.415.5 %   Platelets 343 150 - 400 K/uL   Neutrophils Relative % 82 (H) 43 - 77 %   Neutro Abs 7.1 1.7 - 7.7 K/uL   Lymphocytes Relative 13 12 - 46 %   Lymphs Abs 1.2 0.7 - 4.0 K/uL   Monocytes Relative 5 3 - 12 %   Monocytes Absolute 0.4 0.1 - 1.0 K/uL   Eosinophils Relative 0 0 - 5 %   Eosinophils Absolute 0.0 0.0 - 0.7 K/uL   Basophils Relative 0 0 - 1 %   Basophils Absolute 0.0 0.0 - 0.1 K/uL  Comprehensive metabolic panel  Result Value Ref Range   Sodium 137 137 - 147 mEq/L   Potassium 4.4 3.7 - 5.3 mEq/L   Chloride 101 96 - 112 mEq/L   CO2 22 19 - 32 mEq/L   Glucose, Bld 127 (H) 70 - 99 mg/dL   BUN 9 6 - 23 mg/dL   Creatinine, Ser 7.820.75 0.50 - 1.10 mg/dL   Calcium 9.5 8.4 - 95.610.5 mg/dL   Total Protein 7.3 6.0 - 8.3 g/dL   Albumin 3.7 3.5 - 5.2 g/dL   AST 18 0 - 37 U/L   ALT 11 0 - 35 U/L   Alkaline Phosphatase 64 39 - 117 U/L   Total Bilirubin 0.2 (L) 0.3 - 1.2 mg/dL   GFR calc non Af Amer >90 >90 mL/min   GFR calc Af Amer >90 >90 mL/min   Anion gap 14 5 - 15  Lipase, blood  Result Value Ref Range   Lipase 21 11 - 59 U/L  Urinalysis, Routine w reflex microscopic  Result Value Ref Range   Color, Urine YELLOW YELLOW   APPearance CLOUDY (A) CLEAR   Specific Gravity, Urine 1.022 1.005 - 1.030   pH 7.0 5.0 - 8.0   Glucose, UA NEGATIVE NEGATIVE mg/dL   Hgb urine dipstick NEGATIVE NEGATIVE   Bilirubin Urine NEGATIVE NEGATIVE   Ketones, ur NEGATIVE NEGATIVE mg/dL   Protein, ur 30 (A) NEGATIVE mg/dL   Urobilinogen, UA 0.2 0.0 - 1.0 mg/dL   Nitrite NEGATIVE NEGATIVE   Leukocytes, UA NEGATIVE NEGATIVE  Pregnancy, urine  Result Value Ref Range   Preg  Test, Ur NEGATIVE NEGATIVE  Urine microscopic-add on  Result Value Ref Range   Squamous Epithelial / LPF RARE RARE   Bacteria, UA FEW (A) RARE   [place] Abdomen Complete 11/10/2014   CLINICAL DATA:  Upper abdominal pain  EXAM: ULTRASOUND ABDOMEN COMPLETE  COMPARISON:  None.  FINDINGS: Gallbladder: Contracted gallbladder. Cholelithiasis without gallbladder wall thickening or pericholecystic fluid. No sonographic Murphy sign noted.  Common bile duct: Diameter: 4.4 mm  Liver: No focal lesion identified. Within normal limits in parenchymal echogenicity.  IVC: No abnormality visualized.  Pancreas: Visualized portion unremarkable.  Spleen: Size and appearance within normal limits.  Right Kidney: Length: 10.6 cm. Echogenicity within normal limits. No mass or hydronephrosis visualized.  Left Kidney: Length: 10.6 cm. Echogenicity within normal limits. No mass or hydronephrosis visualized.  Abdominal aorta: No aneurysm visualized.  Other findings: None.  IMPRESSION: 1. Cholelithiasis without sonographic evidence of acute cholecystitis.   Electronically Signed   By: Elige KoHetal  Patel   On: 11/10/2014 04:41   Dg Abd Acute W/chest 11/10/2014   CLINICAL DATA:  Upper abdominal pain  EXAM: ACUTE ABDOMEN SERIES (ABDOMEN 2 VIEW & CHEST 1 VIEW)  COMPARISON:  None.  FINDINGS: There is a gastric lap band present. There is a moderate amount of stool in the colon. There is no evidence of dilated bowel loops or free intraperitoneal air. No radiopaque calculi or other significant radiographic abnormality is seen. Heart size and mediastinal contours are within normal limits. Both lungs are clear.  IMPRESSION: Negative abdominal radiographs.  No acute cardiopulmonary disease.   Electronically Signed   By: Elige KoHetal  Patel   On: 11/10/2014 06:04    [id number]:  No acute cholecystitis or biliary obstruction on workup, but pt continues to c/o abd pain despite multiple doses of IV pain meds. No further vomiting after IV zofran.  T/C to General Surgery  Dr. Corliss Skainssuei, case discussed, including:  HPI, pertinent PM/SHx, VS/PE, dx testing, ED course and treatment:  Agreeable to evaluate pt in the ED.      Samuel JesterKathleen Epifanio Labrador, DO 11/10/14 [phone]

## 2014-11-10 NOTE — ED Notes (Signed)
Up to speak with patient in WR, pt upset she is still in WR and continues to be in pain, pt states she does not understand why she is being made to wait. Attempts made to explain to patient wait and she would be seen asap and that results of her labs. Pt interrupted RN and now wants to be signed out AMA.

## 2014-11-11 ENCOUNTER — Encounter (HOSPITAL_COMMUNITY): Payer: Self-pay | Admitting: Surgery

## 2014-11-11 MED ORDER — DOCUSATE SODIUM 100 MG PO CAPS: 100.0000 mg | ORAL_CAPSULE | Freq: Two times a day (BID) | ORAL | Status: DC | PRN

## 2014-11-11 MED ORDER — POLYETHYLENE GLYCOL 3350 17 G PO PACK: 17.0000 g | PACK | Freq: Two times a day (BID) | ORAL | Status: DC | PRN

## 2014-11-11 MED ORDER — HYDROCODONE-ACETAMINOPHEN 5-325 MG PO TABS
1.0000 | ORAL_TABLET | ORAL | Status: DC | PRN
  Administered 2014-11-11: 2 via ORAL
  Filled 2014-11-11: qty 2

## 2014-11-11 MED ORDER — DSS 100 MG PO CAPS: 100.0000 mg | ORAL_CAPSULE | Freq: Two times a day (BID) | ORAL | Status: DC | PRN

## 2014-11-11 MED ORDER — HYDROCODONE-ACETAMINOPHEN 5-325 MG PO TABS: 1.0000 | ORAL_TABLET | Freq: Four times a day (QID) | ORAL | Status: DC | PRN

## 2014-11-11 NOTE — Progress Notes (Signed)
AVS discharge instructions were reviewed with patient. Patient was given prescription for Vicodin to take to her pharmacy. Will assist patient to transportation when ready.

## 2014-11-11 NOTE — Progress Notes (Signed)
Orthopedic Tech Progress Note Patient Details:  Sarah SpikeLekisha Bunkley 04/11/1976 [id number] Delivered to Nursing station Ortho Devices Type of Ortho Device: Abdominal binder Ortho Device/Splint Interventions: Ordered   [place] R Thompson 11/11/2014, 12:49 PM

## 2014-11-11 NOTE — Discharge Summary (Signed)
[facility] Discharge Summary   Patient ID: Sarah SpikeLekisha Beck MRN: [id number] DOB/AGE: 38/03/1976 38 y.o.  Admit date: 11/10/2014 Discharge date: 11/11/2014  Admitting Diagnosis: Acute calculous cholecystitis  Discharge Diagnosis Patient Active Problem List   Diagnosis Date Noted  . Acute calculous cholecystitis 11/10/2014    Consultants None  Imaging: Dg Cholangiogram Operative  11/10/2014   CLINICAL DATA:  Acute cholecystitis  EXAM: INTRAOPERATIVE CHOLANGIOGRAM  TECHNIQUE: Cholangiographic images from the C-arm fluoroscopic device were submitted for interpretation post-operatively. Please see the procedural report for the amount of contrast and the fluoroscopy time utilized.  COMPARISON:  11/10/2014  FINDINGS: Intraoperative cholangiogram performed during the laparoscopic cholecystectomy. Intrahepatic ducts, biliary confluence, common hepatic duct, cystic duct, and common bile duct are all patent. Negative for biliary obstruction or dilatation. No filling defect. Contrast drains into the duodenum.  IMPRESSION: Patent biliary system.   Electronically Signed   By: Ruel Favorsrevor  Shick M.D.   On: 11/10/2014 15:31   [place] Abdomen Complete  11/10/2014   CLINICAL DATA:  Upper abdominal pain  EXAM: ULTRASOUND ABDOMEN COMPLETE  COMPARISON:  None.  FINDINGS: Gallbladder: Contracted gallbladder. Cholelithiasis without gallbladder wall thickening or pericholecystic fluid. No sonographic Murphy sign noted.  Common bile duct: Diameter: 4.4 mm  Liver: No focal lesion identified. Within normal limits in parenchymal echogenicity.  IVC: No abnormality visualized.  Pancreas: Visualized portion unremarkable.  Spleen: Size and appearance within normal limits.  Right Kidney: Length: 10.6 cm. Echogenicity within normal limits. No mass or hydronephrosis visualized.  Left Kidney: Length: 10.6 cm. Echogenicity within normal limits. No mass or hydronephrosis visualized.  Abdominal aorta: No aneurysm visualized.   Other findings: None.  IMPRESSION: 1. Cholelithiasis without sonographic evidence of acute cholecystitis.   Electronically Signed   By: Elige KoHetal  Patel   On: 11/10/2014 04:41   Dg Abd Acute W/chest  11/10/2014   CLINICAL DATA:  Upper abdominal pain  EXAM: ACUTE ABDOMEN SERIES (ABDOMEN 2 VIEW & CHEST 1 VIEW)  COMPARISON:  None.  FINDINGS: There is a gastric lap band present. There is a moderate amount of stool in the colon. There is no evidence of dilated bowel loops or free intraperitoneal air. No radiopaque calculi or other significant radiographic abnormality is seen. Heart size and mediastinal contours are within normal limits. Both lungs are clear.  IMPRESSION: Negative abdominal radiographs.  No acute cardiopulmonary disease.   Electronically Signed   By: Elige KoHetal  Patel   On: 11/10/2014 06:04    Procedures Dr. Luisa Hartornett (11/10/14) - [facility]  Hospital Course:  38 y/o AA female presents to ED with 12 hour hx RUQ abdominal pain Sharp severe and has not improved. Has been given pain meds and this helps but pain returns once pain medicine wears off it returns. No vomiting. Has lap band.   Workup showed likely acute cholecystitis and biliary colic.  Patient was admitted and underwent procedure listed above.  Tolerated procedure well and was transferred to the floor.  Was found to have signs of chronic cholecystitis with cholelithiasis.  Diet was advanced as tolerated.  On POD #1, the patient was voiding well, tolerating diet, ambulating well, pain well controlled, vital signs stable, incisions c/d/i and felt stable for discharge home.  Patient will follow up in our office in 2-3 weeks and knows to call with questions or concerns.   Physical Exam: General:  Alert, NAD, pleasant, comfortable Abd:  Soft, ND, mild tenderness, incisions C/D/I    Medication List    TAKE these medications  acetaminophen 500 MG tablet  Commonly known as:  TYLENOL  Take 500 mg by  mouth every 6 (six) hours as needed for mild pain.     DSS 100 MG Caps  Take 100 mg by mouth 2 (two) times daily as needed for mild constipation or moderate constipation.     HYDROcodone-acetaminophen 5-325 MG per tablet  Commonly known as:  NORCO/VICODIN  Take 1-2 tablets by mouth every 6 (six) hours as needed for moderate pain or severe pain.     LORazepam 1 MG tablet  Commonly known as:  ATIVAN  Take 1 mg by mouth daily as needed for anxiety.     multivitamin with minerals tablet  Take 1 tablet by mouth daily.     polyethylene glycol packet  Commonly known as:  MIRALAX / GLYCOLAX  Take 17 g by mouth 2 (two) times daily as needed for mild constipation or moderate constipation.         Follow-up Information    [facility] [place] On [facility].   Why:  For post-operation check at 2:45pm, please arrive by 2:15pm to check in and fill out paperwork.   Contact information:   [address]   [place] [id number] [phone]       Signed: Candiss NorseMegan Dort, PA-C [facility] [phone]  11/11/2014, 9:33 AM

## 2014-11-11 NOTE — Discharge Instructions (Signed)

## 2014-11-11 NOTE — Plan of Care (Signed)
Problem: Discharge Progression Outcomes Goal: Discharge plan in place and appropriate Outcome: Completed/Met Date Met:  11/11/14 Goal: Pain controlled with appropriate interventions Outcome: Adequate for Discharge Goal: Tolerating diet Outcome: Adequate for Discharge Goal: Activity appropriate for discharge plan Outcome: Adequate for Discharge Goal: Tubes and drains discontinued if indicated Outcome: Not Applicable Date Met:  50/27/14 Goal: Staples/sutures removed Outcome: Not Applicable Date Met:  23/20/09 Goal: Steri-Strips applied Outcome: Not Applicable Date Met:  41/79/19

## 2014-11-15 ENCOUNTER — Other Ambulatory Visit: Payer: Self-pay

## 2014-11-19 LAB — CYTOLOGY - PAP

## 2014-11-26 ENCOUNTER — Other Ambulatory Visit: Payer: Self-pay | Admitting: Family

## 2014-11-26 DIAGNOSIS — R1011 Right upper quadrant pain: Secondary | ICD-10-CM

## 2015-04-14 ENCOUNTER — Other Ambulatory Visit: Payer: Self-pay | Admitting: Gynecology

## 2015-04-15 LAB — CYTOLOGY - PAP

## 2015-07-14 ENCOUNTER — Other Ambulatory Visit: Payer: Self-pay | Admitting: Gynecology

## 2016-08-17 ENCOUNTER — Other Ambulatory Visit: Payer: Self-pay | Admitting: General Surgery

## 2016-08-17 DIAGNOSIS — K219 Gastro-esophageal reflux disease without esophagitis: Principal | ICD-10-CM

## 2016-08-17 DIAGNOSIS — IMO0001 Reserved for inherently not codable concepts without codable children: Secondary | ICD-10-CM

## 2016-09-26 ENCOUNTER — Emergency Department (HOSPITAL_COMMUNITY)
Admission: EM | Admit: 2016-09-26 | Discharge: 2016-09-26 | Disposition: A | Discharge status: Home / Self Care | Payer: 59 | Attending: Emergency Medicine | Admitting: Emergency Medicine

## 2016-09-26 ENCOUNTER — Emergency Department (HOSPITAL_COMMUNITY): Payer: 59

## 2016-09-26 ENCOUNTER — Encounter (HOSPITAL_COMMUNITY): Payer: Self-pay | Admitting: Emergency Medicine

## 2016-09-26 DIAGNOSIS — K2970 Gastritis, unspecified, without bleeding: Secondary | ICD-10-CM | POA: Diagnosis not present

## 2016-09-26 DIAGNOSIS — R1011 Right upper quadrant pain: Secondary | ICD-10-CM

## 2016-09-26 LAB — CBC
HCT: 42.1 % (ref 36.0–46.0)
Hemoglobin: 14.3 g/dL (ref 12.0–15.0)
MCH: 30.2 pg (ref 26.0–34.0)
MCHC: 34 g/dL (ref 30.0–36.0)
MCV: 88.8 fL (ref 78.0–100.0)
PLATELETS: 327 10*3/uL (ref 150–400)
RBC: 4.74 MIL/uL (ref 3.87–5.11)
RDW: 12.8 % (ref 11.5–15.5)
WBC: 9.7 10*3/uL (ref 4.0–10.5)

## 2016-09-26 LAB — COMPREHENSIVE METABOLIC PANEL
ALK PHOS: 66 U/L (ref 38–126)
ALT: 17 U/L (ref 14–54)
AST: 21 U/L (ref 15–41)
Albumin: 3.7 g/dL (ref 3.5–5.0)
Anion gap: 7 (ref 5–15)
BUN: 12 mg/dL (ref 6–20)
CALCIUM: 9.3 mg/dL (ref 8.9–10.3)
CO2: 25 mmol/L (ref 22–32)
CREATININE: 0.93 mg/dL (ref 0.44–1.00)
Chloride: 107 mmol/L (ref 101–111)
GFR calc non Af Amer: >60 mL/min (ref >60)
GLUCOSE: 127 mg/dL — AB (ref 65–99)
Potassium: 4 mmol/L (ref 3.5–5.1)
SODIUM: 139 mmol/L (ref 135–145)
Total Bilirubin: 0.6 mg/dL (ref 0.3–1.2)
Total Protein: 7.1 g/dL (ref 6.5–8.1)

## 2016-09-26 LAB — LIPASE, BLOOD: Lipase: 23 U/L (ref 11–51)

## 2016-09-26 MED ORDER — OMEPRAZOLE 20 MG PO CPDR: 20.0000 mg | DELAYED_RELEASE_CAPSULE | Freq: Every day | ORAL | 2 refills | Status: DC

## 2016-09-26 MED ORDER — ONDANSETRON HCL 4 MG PO TABS: 4.0000 mg | ORAL_TABLET | Freq: Four times a day (QID) | ORAL | 0 refills | Status: DC

## 2016-09-26 MED ORDER — HYDROCODONE-ACETAMINOPHEN 5-325 MG PO TABS: 1.0000 | ORAL_TABLET | ORAL | 0 refills | Status: DC | PRN

## 2016-09-26 MED ORDER — FAMOTIDINE IN NACL 20-0.9 MG/50ML-% IV SOLN
20.0000 mg | Freq: Once | INTRAVENOUS | Status: AC
  Administered 2016-09-26: 20 mg via INTRAVENOUS
  Filled 2016-09-26: qty 50

## 2016-09-26 MED ORDER — SUCRALFATE 1 GM/10ML PO SUSP: 1.0000 g | Freq: Three times a day (TID) | ORAL | 0 refills | Status: DC

## 2016-09-26 MED ORDER — FENTANYL CITRATE (PF) 100 MCG/2ML IJ SOLN
50.0000 ug | INTRAMUSCULAR | Status: DC | PRN
Start: 2016-09-26 — End: 2016-09-26
  Administered 2016-09-26: 50 ug via INTRAVENOUS
  Filled 2016-09-26: qty 2

## 2016-09-26 MED ORDER — ONDANSETRON HCL 4 MG/2ML IJ SOLN
4.0000 mg | Freq: Once | INTRAMUSCULAR | Status: AC
  Administered 2016-09-26: 4 mg via INTRAVENOUS
  Filled 2016-09-26: qty 2

## 2016-09-26 NOTE — ED Provider Notes (Signed)
[facility]-EMERGENCY DEPT Provider Note   CSN: [id number] Arrival date & time: 09/26/16  0058     History   Chief Complaint Chief Complaint  Patient presents with  . Abdominal Pain    HPI Sarah Beck is a 40 y.o. female.  HPI   Patient comes to hte ER with com right upper quadrant abdominal pain with nausea and vomiting in the evening. She has not had any fevers or chills no diarrhea. She has a lap-band and hx of cholecystectomy. It has been chronic and ongoing for the past 3 months. Patient has history of acid reflux and used to take prevacid but stopped because she felt like she was better a few months ago.  No CP, diarrhea, fevers, weakness, headaches, confusion, back pain, hematemesis.  Past Medical History:  Diagnosis Date  . LAP-BAND surgery status 2010    Patient Active Problem List   Diagnosis Date Noted  . Acute calculous cholecystitis 11/10/2014    Past Surgical History:  Procedure Laterality Date  . CHOLECYSTECTOMY N/A 11/10/2014   Procedure: LAPAROSCOPIC CHOLECYSTECTOMY WITH INTRAOPERATIVE CHOLANGIOGRAM;  Surgeon: Harriette Bouillon, MD;  Location: [facility] OR;  Service: General;  Laterality: N/A;    OB History    No data available      Home Medications    Prior to Admission medications   Medication Sig Start Date End Date Taking? Authorizing Provider  acetaminophen (TYLENOL) 500 MG tablet Take 500 mg by mouth every 6 (six) hours as needed for mild pain.    Historical Provider, MD  docusate sodium 100 MG CAPS Take 100 mg by mouth 2 (two) times daily as needed for mild constipation or moderate constipation. 11/11/14   Nonie Hoyer, PA-C  HYDROcodone-acetaminophen (NORCO/VICODIN) 5-325 MG per tablet Take 1-2 tablets by mouth every 6 (six) hours as needed for moderate pain or severe pain. 11/11/14   Nonie Hoyer, PA-C  HYDROcodone-acetaminophen (NORCO/VICODIN) 5-325 MG tablet Take 1-2 tablets by mouth every 4 (four) hours as needed. 09/26/16   Deontay Ladnier Neva Seat, PA-C    LORazepam (ATIVAN) 1 MG tablet Take 1 mg by mouth daily as needed for anxiety.    Historical Provider, MD  Multiple Vitamins-Minerals (MULTIVITAMIN WITH MINERALS) tablet Take 1 tablet by mouth daily.    Historical Provider, MD  omeprazole (PRILOSEC) 20 MG capsule Take 1 capsule (20 mg total) by mouth daily. 09/26/16   Harjit Douds Neva Seat, PA-C  ondansetron (ZOFRAN) 4 MG tablet Take 1 tablet (4 mg total) by mouth every 6 (six) hours. 09/26/16   Kimley Apsey Neva Seat, PA-C  polyethylene glycol (MIRALAX / GLYCOLAX) packet Take 17 g by mouth 2 (two) times daily as needed for mild constipation or moderate constipation. 11/11/14   Nonie Hoyer, PA-C  sucralfate (CARAFATE) 1 GM/10ML suspension Take 10 mLs (1 g total) by mouth 4 (four) times daily -  with meals and at bedtime. 09/26/16   Marlon Pel, PA-C    Family History No family history on file.  Social History Social History  Substance Use Topics  . Smoking status: Never Smoker  . Smokeless tobacco: Never Used  . Alcohol use No     Allergies   Review of patient's allergies indicates no known allergies.   Review of Systems Review of Systems Review of Systems All other systems negative except as documented in the HPI. All pertinent positives and negatives as reviewed in the HPI.   Physical Exam Updated Vital Signs BP 118/76   Pulse 91   Temp 98 F (36.7 C) (Oral)  Resp 18   LMP 09/23/2016 (Approximate)   SpO2 99%   Physical Exam  Constitutional: She appears well-developed and well-nourished.  HENT:  Head: Normocephalic and atraumatic.  Eyes: Conjunctivae are normal. Pupils are equal, round, and reactive to light.  Neck: Trachea normal, normal range of motion and full passive range of motion without pain. Neck supple.  Cardiovascular: Normal rate, regular rhythm and normal pulses.   Pulmonary/Chest: Effort normal and breath sounds normal. Chest wall is not dull to percussion. She exhibits no tenderness, no crepitus, no edema, no  deformity and no retraction.  Abdominal: Soft. Normal appearance and bowel sounds are normal. She exhibits no distension and no ascites. There is tenderness in the right upper quadrant and epigastric area. There is no rigidity, no rebound, no guarding and negative Murphy's sign.  Musculoskeletal: Normal range of motion.  Neurological: She is alert. She has normal strength.  Skin: Skin is warm, dry and intact.  Psychiatric: She has a normal mood and affect. Her speech is normal and behavior is normal. Judgment and thought content normal. Cognition and memory are normal.    ED Treatments / Results  Labs (all labs ordered are listed, but only abnormal results are displayed) Labs Reviewed  COMPREHENSIVE METABOLIC PANEL - Abnormal; Notable for the following:       Result Value   Glucose, Bld 127 (*)    All other components within normal limits  LIPASE, BLOOD  CBC    EKG  EKG Interpretation None       Radiology US Abdomen Limited Ruq  Result Date: 09/26/2016 CLINICAL DATA:  Chronic right upper quadrant abdominal pain, and acute mid abdominal pain. Nausea, vomiting and diarrhea. Initial encounter. EXAM: US ABDOMEN LIMITED - RIGHT UPPER QUADRANT COMPARISON:  Abdominal ultrasound performed 11/10/2014 FINDINGS: Gallbladder: Status post cholecystectomy.  No retained stones seen. Common bile duct: Diameter: 0.2 cm, within normal limits in caliber. Liver: No focal lesion identified. Within normal limits in parenchymal echogenicity. IMPRESSION: Status post cholecystectomy. No acute abnormality at the right upper quadrant. Electronically Signed   By: Roanna Raider M.D.   On: 09/26/2016 02:52   Procedures Procedures (including critical care time)  Medications Ordered in ED Medications  fentaNYL (SUBLIMAZE) injection 50 mcg (50 mcg Intravenous Given 09/26/16 0339)  ondansetron (ZOFRAN) injection 4 mg (4 mg Intravenous Given 09/26/16 0334)  famotidine (PEPCID) IVPB 20 mg premix (0 mg Intravenous  Stopped 09/26/16 0419)    Initial Impression / Assessment and Plan / ED Course  I have reviewed the triage vital signs and the nursing notes.  Pertinent labs & imaging results that were available during my care of the patient were reviewed by me and considered in my medical decision making (see chart for details).  Clinical Course    Patient with symptoms consistent with gastritis.  Vitals are stable, no fever.  No signs of dehydration, tolerating PO fluids > 6 oz.  Lungs are clear.  No focal abdominal pain, no concern for appendicitis, cholecystitis, pancreatitis, ruptured viscus, UTI, kidney stone, or any other abdominal etiology.  Supportive therapy indicated with return if symptoms worsen.  Patient counseled. Patient will be referred to GI for endoscopy and started on PPI   Final Clinical Impressions(s) / ED Diagnoses   Final diagnoses:  Right upper quadrant pain  Gastritis    New Prescriptions New Prescriptions   HYDROCODONE-ACETAMINOPHEN (NORCO/VICODIN) 5-325 MG TABLET    Take 1-2 tablets by mouth every 4 (four) hours as needed.   OMEPRAZOLE (PRILOSEC) 20  MG CAPSULE    Take 1 capsule (20 mg total) by mouth daily.   ONDANSETRON (ZOFRAN) 4 MG TABLET    Take 1 tablet (4 mg total) by mouth every 6 (six) hours.   SUCRALFATE (CARAFATE) 1 GM/10ML SUSPENSION    Take 10 mLs (1 g total) by mouth 4 (four) times daily -  with meals and at bedtime.     Marlon Peliffany Delvina Mizzell, PA-C 09/26/16 [id number]    Rolland PorterMark James, MD 09/30/16 [phone]

## 2016-09-26 NOTE — ED Triage Notes (Signed)
Pt. reports chronic RUQ pain for 3 months worse this past several days with nausea and emesis this evening , denies fever or chills , no diarrhea .

## 2016-10-01 ENCOUNTER — Other Ambulatory Visit: Payer: Self-pay | Admitting: General Surgery

## 2016-10-01 DIAGNOSIS — K219 Gastro-esophageal reflux disease without esophagitis: Secondary | ICD-10-CM

## 2016-10-01 DIAGNOSIS — Z9884 Bariatric surgery status: Secondary | ICD-10-CM

## 2016-10-07 ENCOUNTER — Other Ambulatory Visit: Payer: 59

## 2016-10-11 ENCOUNTER — Ambulatory Visit
Admission: RE | Admit: 2016-10-11 | Discharge: 2016-10-11 | Disposition: A | Discharge status: Home / Self Care | Payer: 59 | Source: Ambulatory Visit | Attending: General Surgery | Admitting: General Surgery

## 2016-10-11 DIAGNOSIS — K219 Gastro-esophageal reflux disease without esophagitis: Secondary | ICD-10-CM

## 2016-10-11 DIAGNOSIS — Z9884 Bariatric surgery status: Secondary | ICD-10-CM

## 2017-02-03 ENCOUNTER — Ambulatory Visit: Payer: 59 | Admitting: Gastroenterology

## 2017-02-10 ENCOUNTER — Encounter: Payer: Self-pay | Admitting: Physician Assistant

## 2017-02-10 ENCOUNTER — Encounter (INDEPENDENT_AMBULATORY_CARE_PROVIDER_SITE_OTHER): Payer: Self-pay

## 2017-02-10 ENCOUNTER — Ambulatory Visit (INDEPENDENT_AMBULATORY_CARE_PROVIDER_SITE_OTHER): Payer: 59 | Admitting: Physician Assistant

## 2017-02-10 VITALS — Ht 66.0 in | Wt 207.0 lb

## 2017-02-10 DIAGNOSIS — R1011 Right upper quadrant pain: Secondary | ICD-10-CM | POA: Diagnosis not present

## 2017-02-10 DIAGNOSIS — K5900 Constipation, unspecified: Secondary | ICD-10-CM | POA: Diagnosis not present

## 2017-02-10 MED ORDER — POLYETHYLENE GLYCOL 3350 17 GM/SCOOP PO POWD: 1.0000 | Freq: Every day | ORAL | 2 refills | Status: DC

## 2017-02-10 NOTE — Patient Instructions (Signed)
We have sent the following medications to your pharmacy for you to pick up at your convenience: Miralax 17 grams daily in 8 oz of water or juice.

## 2017-02-10 NOTE — Progress Notes (Addendum)
Chief Complaint: RUQ abdominal pain, constipation  HPI:  [name] is a 41 year old African-American female who works as a Engineer, civil (consulting)nurse with past medical history significant for cholecystectomy and lap-band surgery done in [place], who presents to clinic today with a complaint of very intermittent right upper quadrant abdominal pain.   Patient had recent upper GI, high density with KUB on 10/11/16 for a right upper quadrant pain and this was normal with evidence of previous lap band placement. Patient also had right upper quadrant ultrasound completed 09/26/16 for this same right upper quadrant pain with findings of status post cholecystectomy and no acute abnormality at the right upper quadrant. Patient has had a normal CBC, CMP and lipase and evaluation of this pain.   Today, the patient tells me that she has very intermittent right upper quadrant abdominal pain. She tells that when this occurs it is typically within 15-20 minutes after eating and is a "sharp pain", that will "throb" for about 15-20 minutes before it goes away. She tells me that typically it can come back 2 or 3 times over the span of 1-2 days and then is gone for a couple of months before it comes back. The patient tells me that she has tried to watch what she is eating but has not been able to identify a specific food which makes it worse other than sometimes greasy and fatty foods. Patient tells me this has been happening over the past year and she has had evaluation for this in the past. Patient does sometimes take Motrin during these times which helps her pain.   Patient also describes chronic constipation noting that she only has one bowel movement per week typically. She will take Dulcolax 2 times a month to help with this, but has never tried any other laxatives. She is unsure if the pain above comes on when she is constipated or not. She tells me this "could be true".   Patient denies fever, chills, blood in her stool, melena, weight  loss, fatigue, anorexia, nausea, vomiting, heartburn, reflux, dysphagia or symptoms that awaken her at night.  Past Medical History:  Diagnosis Date  . Hx of cholecystectomy 2015  . LAP-BAND surgery status 2010    Past Surgical History:  Procedure Laterality Date  . CHOLECYSTECTOMY N/A 11/10/2014   Procedure: LAPAROSCOPIC CHOLECYSTECTOMY WITH INTRAOPERATIVE CHOLANGIOGRAM;  Surgeon: Harriette Bouillonhomas Cornett, MD;  Location: [facility] OR;  Service: General;  Laterality: N/A;    Current Outpatient Prescriptions  Medication Sig Dispense Refill  . aspirin-acetaminophen-caffeine (EXCEDRIN MIGRAINE) 250-250-65 MG tablet Take by mouth every 6 (six) hours as needed for headache.    . docusate sodium 100 MG CAPS Take 100 mg by mouth 2 (two) times daily as needed for mild constipation or moderate constipation. 10 capsule 0  . LORazepam (ATIVAN) 1 MG tablet Take 1 mg by mouth daily as needed for anxiety.    . Multiple Vitamins-Minerals (MULTIVITAMIN WITH MINERALS) tablet Take 1 tablet by mouth daily.    . ondansetron (ZOFRAN) 4 MG tablet Take 1 tablet (4 mg total) by mouth every 6 (six) hours. 12 tablet 0  . HYDROcodone-acetaminophen (NORCO/VICODIN) 5-325 MG per tablet Take 1-2 tablets by mouth every 6 (six) hours as needed for moderate pain or severe pain. (Patient not taking: Reported on 02/10/2017) 40 tablet 0  . polyethylene glycol powder (GLYCOLAX/MIRALAX) powder Take 255 g by mouth daily. 17 g 2   No current facility-administered medications for this visit.     Allergies as of 02/10/2017  . (  No Known Allergies)    Family History  Problem Relation Age of Onset  . Diabetes Mother   . Heart disease Mother   . Colon polyps Mother   . Diabetes Father   . Heart disease Father   . Heart disease Maternal Grandmother   . Alzheimer's disease Maternal Grandmother     Social History   Social History  . Marital status: Single    Spouse name: N/A  . Number of children: N/A  . Years of education: N/A    Occupational History  . Not on file.   Social History Main Topics  . Smoking status: Never Smoker  . Smokeless tobacco: Never Used  . Alcohol use No  . Drug use: No  . Sexual activity: Yes   Other Topics Concern  . Not on file   Social History Narrative  . No narrative on file    Review of Systems:     Constitutional: No weight loss, fever, chills, weakness or fatigue HEENT: Eyes: No change in vision               Ears, Nose, Throat:  No change in hearing or congestion Skin: No rash or itching Cardiovascular: No chest pain, chest pressure or palpitations   Respiratory: No SOB or cough Gastrointestinal: See HPI and otherwise negative Genitourinary: No dysuria or change in urinary frequency Neurological: No headache, dizziness or syncope Musculoskeletal: No new muscle or joint pain Hematologic: No bleeding or bruising Psychiatric: No history of depression or anxiety   Physical Exam:  Vital signs: Ht 5\' 6"  (1.676 m)   Wt 207 lb (93.9 kg)   BMI 33.41 kg/m    Constitutional:   Pleasant African-American female appears to be in NAD, Well developed, Well nourished, alert and cooperative Head:  Normocephalic and atraumatic. Eyes:   PEERL, EOMI. No icterus. Conjunctiva pink. Ears:  Normal auditory acuity. Neck:  Supple Throat: Oral cavity and pharynx without inflammation, swelling or lesion.  Respiratory: Respirations even and unlabored. Lungs clear to auscultation bilaterally.   No wheezes, crackles, or rhonchi.  Cardiovascular: Normal S1, S2. No MRG. Regular rate and rhythm. No peripheral edema, cyanosis or pallor.  Gastrointestinal:  Soft, nondistended, nontender. No rebound or guarding. Normal bowel sounds. No appreciable masses or hepatomegaly. Rectal:  Not performed.  Msk:  Symmetrical without gross deformities. Without edema, no deformity or joint abnormality.  Neurologic:  Alert and  oriented x4;  grossly normal neurologically.  Skin:   Dry and intact without  significant lesions or rashes. Psychiatric:  Demonstrates good judgement and reason without abnormal affect or behaviors.  MOST RECENT LABS: CBC    Component Value Date/Time   WBC 9.7 09/26/2016 0112   RBC 4.74 09/26/2016 0112   HGB 14.3 09/26/2016 0112   HCT 42.1 09/26/2016 0112   PLT 327 09/26/2016 0112   MCV 88.8 09/26/2016 0112   MCH 30.2 09/26/2016 0112   MCHC 34.0 09/26/2016 0112   RDW 12.8 09/26/2016 0112   LYMPHSABS 1.2 11/10/2014 0231   MONOABS 0.4 11/10/2014 0231   EOSABS 0.0 11/10/2014 0231   BASOSABS 0.0 11/10/2014 0231    CMP     Component Value Date/Time   NA 139 09/26/2016 0112   K 4.0 09/26/2016 0112   CL 107 09/26/2016 0112   CO2 25 09/26/2016 0112   GLUCOSE 127 (H) 09/26/2016 0112   BUN 12 09/26/2016 0112   CREATININE 0.93 09/26/2016 0112   CALCIUM 9.3 09/26/2016 0112   PROT 7.1 09/26/2016 0112  ALBUMIN 3.7 09/26/2016 0112   AST 21 09/26/2016 0112   ALT 17 09/26/2016 0112   ALKPHOS 66 09/26/2016 0112   BILITOT 0.6 09/26/2016 0112   GFRNONAA >60 09/26/2016 0112   GFRAA >60 09/26/2016 0112   Assessment: 1. Intermittent right upper quadrant abdominal pain: Patient reports abdominal pain that occurs for 15-20 minutes, 3-4 times over a span of 1-2 days and then will go away for a few months and come back, it has been doing this for a year, history of lap band surgery in [place] as well as cholecystectomy, recent right upper quadrant ultrasound and imaging normal, labs normal; consider relation to constipation versus IBS versus gastritis versus other 2. Constipation: Patient reports this is chronic for her with a bowel movement typically once a week, question whether it could be contributing to above  Plan: 1. Prescribed polyethylene glycol to be used once daily, titrated to a soft regular bowel movement. 2. Discussed with the patient that if she does develop her right upper quadrant pain again she should keep a food and symptom journal. 3. We did discuss  further imaging with a CT or other modality which the patient declines at this time 4. Patient to follow in clinic in 2-3 months with Dr. Myrtie Neither or sooner if necessary  Hyacinth Meeker, PA-C [facility] 02/10/2017, 3:31 PM  Cc: No ref. provider found   Thank you for sending this case to me. I have reviewed the entire note, and the outlined plan seems appropriate.  I agree that the RUQ pain sounds benign.  Amada Jupiter, MD

## 2017-03-16 ENCOUNTER — Telehealth: Payer: Self-pay | Admitting: Physician Assistant

## 2017-03-16 MED ORDER — HYOSCYAMINE SULFATE 0.125 MG SL SUBL: 0.1250 mg | SUBLINGUAL_TABLET | SUBLINGUAL | 1 refills | Status: DC | PRN

## 2017-03-16 NOTE — Telephone Encounter (Signed)
Yes, can try Hyoscyamine Sulfate 0.125 mg q4-6 hours for cramping #15 refill x1  Thanks-JLL

## 2017-03-16 NOTE — Telephone Encounter (Signed)
Patient reports that she is having abdominal pain and cramping.  She had some diarrhea over the weekend after eating out, but now just experiencing cramping with nausea.  I will call her in a refill of zofran.  Victorino DikeJennifer can we please send her in an antispasmodic for her cramping?

## 2017-03-16 NOTE — Telephone Encounter (Signed)
Patient notified rx sent 

## 2017-03-17 ENCOUNTER — Other Ambulatory Visit: Payer: Self-pay

## 2017-03-17 ENCOUNTER — Telehealth: Payer: Self-pay | Admitting: Gastroenterology

## 2017-03-17 MED ORDER — ONDANSETRON HCL 4 MG PO TABS: 4.0000 mg | ORAL_TABLET | Freq: Four times a day (QID) | ORAL | 0 refills | Status: DC

## 2017-03-17 NOTE — Telephone Encounter (Signed)
Patient advised, she will give it time to work.

## 2017-03-17 NOTE — Telephone Encounter (Signed)
Patient is concerned about the diarrhea. States has had it x 3 since the weekend, usually after eating. She wants to know if she can take something OTC to help with the diarrhea. She did get the hyoscyamine for the cramping and states this has helped. She did check with pharmacy and refill on zofran was not called in, will send in refill. Patient has office appointment on 4/24.

## 2017-03-17 NOTE — Telephone Encounter (Signed)
The hyoscyamine prescribed yesterday can also be used as needed for diarrhea.  Give it some time.

## 2017-04-19 ENCOUNTER — Ambulatory Visit: Payer: 59 | Admitting: Gastroenterology

## 2017-04-19 ENCOUNTER — Telehealth: Payer: Self-pay | Admitting: Gastroenterology

## 2017-04-19 NOTE — Telephone Encounter (Signed)
Not this time.  2nd late cancel is a charge

## 2017-05-12 ENCOUNTER — Encounter (INDEPENDENT_AMBULATORY_CARE_PROVIDER_SITE_OTHER): Payer: Self-pay

## 2017-05-12 ENCOUNTER — Ambulatory Visit (INDEPENDENT_AMBULATORY_CARE_PROVIDER_SITE_OTHER): Payer: 59 | Admitting: Gastroenterology

## 2017-05-12 ENCOUNTER — Encounter: Payer: Self-pay | Admitting: Gastroenterology

## 2017-05-12 VITALS — BP 130/94 | HR 88 | Ht 65.0 in | Wt 207.4 lb

## 2017-05-12 DIAGNOSIS — R1011 Right upper quadrant pain: Secondary | ICD-10-CM | POA: Diagnosis not present

## 2017-05-12 DIAGNOSIS — K5901 Slow transit constipation: Secondary | ICD-10-CM | POA: Diagnosis not present

## 2017-05-12 NOTE — Patient Instructions (Signed)
If you are age 41 or older, your body mass index should be between 23-30. Your Body mass index is 34.51 kg/m. If this is out of the aforementioned range listed, please consider follow up with your Primary Care Provider.  If you are age 164 or younger, your body mass index should be between 19-25. Your Body mass index is 34.51 kg/m. If this is out of the aformentioned range listed, please consider follow up with your Primary Care Provider.   Follow up as needed.  Thank you for choosing [facility]  Dr Amada JupiterHenry Danis III

## 2017-05-12 NOTE — Progress Notes (Signed)
     [facility] Progress Note  Chief Complaint: Right upper quadrant pain  Subjective  History:  This is follow-up for 41 year old woman who last saw our PA in February of this year. She has intermittent right upper quadrant pain that is sometimes dull or other times sharp. It varies in intensity and last from a few minutes to 20 minutes. It is sometimes associated with food, no apparent relation to position time of day or bowel movements. She tends toward constipation for which she takes stimulant laxatives regularly. She denies nausea, vomiting, early satiety or weight loss.  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  ROS: Cardiovascular:  no chest pain Respiratory: no dyspnea  The patient's Past Medical, Family and Social History were reviewed and are on file in the EMR.  Objective:  Med list reviewed  Vital signs in last 24 hrs: Vitals:   05/12/17 0832  BP: (!) 130/94  Pulse: 88    Physical Exam    HEENT: sclera anicteric, oral mucosa moist without lesions  Neck: supple, no thyromegaly, JVD or lymphadenopathy  Cardiac: RRR without murmurs, S1S2 heard, no peripheral edema  Pulm: clear to auscultation bilaterally, normal RR and effort noted  Abdomen: soft, No tenderness, with active bowel sounds. No guarding or palpable hepatosplenomegaly.  Skin; warm and dry, no jaundice or rash    @ASSESSMENTPLANBEGIN @ Assessment: Encounter Diagnoses  Name Primary?  . RUQ abdominal pain Yes  . Slow transit constipation     This appears to be functional bowel pain based on description and negative workup so far. She inquired about an upper endoscopy, but I think that would likely be a low yield study. This does not sound like peptic ulcer disease.   Plan: As needed use of antispasmodic See me as needed if symptoms should change and certainly if they worsen.   Total time 15 minutes, over half spent in  counseling and coordination of care.   Charlie PitterHenry L Danis III

## 2018-02-07 IMAGING — US US ABDOMEN LIMITED
1 series · 14 of 25 positions shown · non-contrast
Comparison: Abdominal ultrasound performed 11/10/2014

CLINICAL DATA: Chronic right upper quadrant abdominal pain, and
acute mid abdominal pain. Nausea, vomiting and diarrhea. Initial
encounter.

EXAM:
US ABDOMEN LIMITED - RIGHT UPPER QUADRANT

[Series 1: us abdomen limited · 0.22mm/px · 14 of 31 slices shown]
[im 1/31]
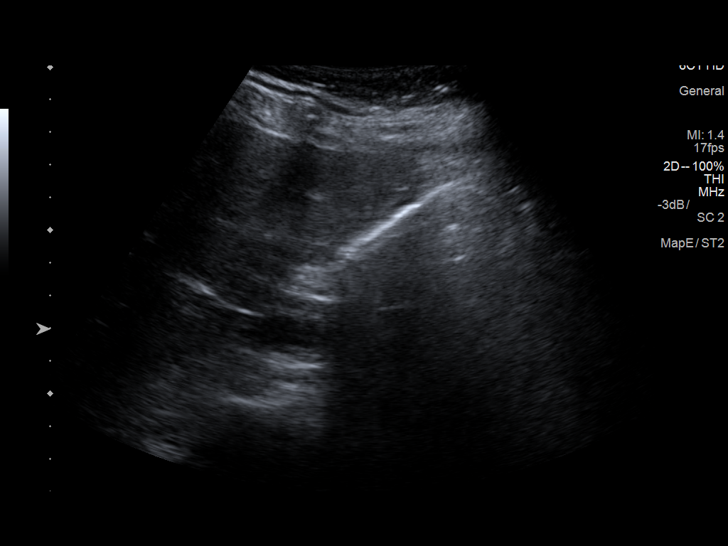
[im 3/31]
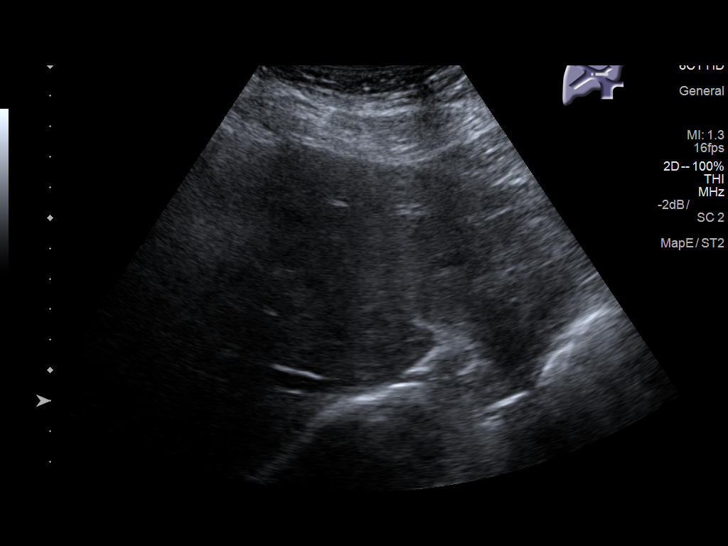
[im 6/31]
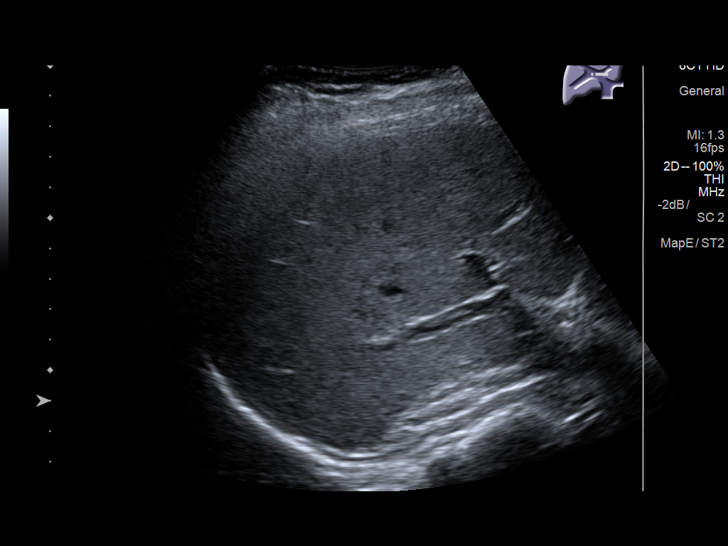
[im 8/31]
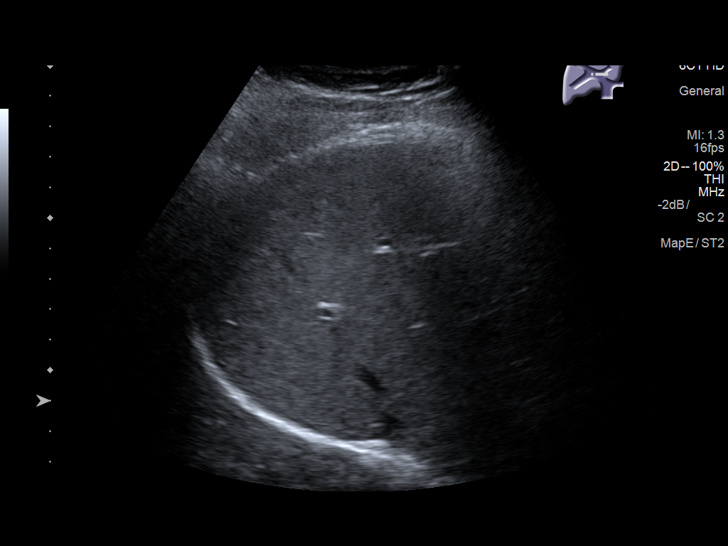
[im 11/31]
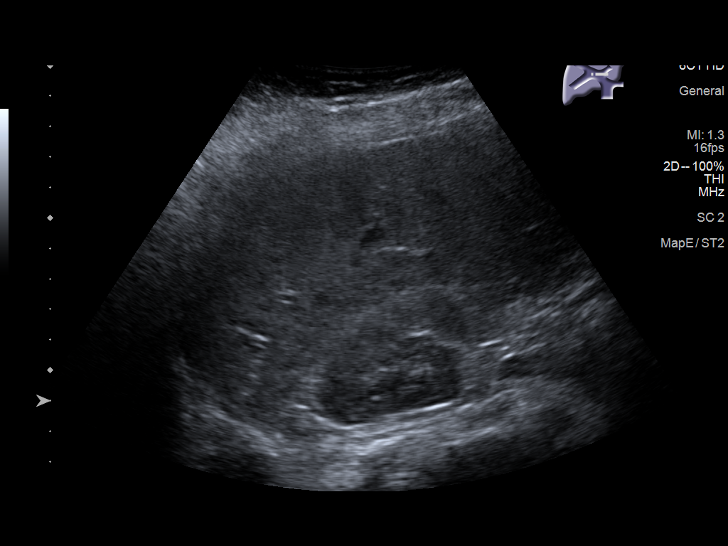
[im 12/31]
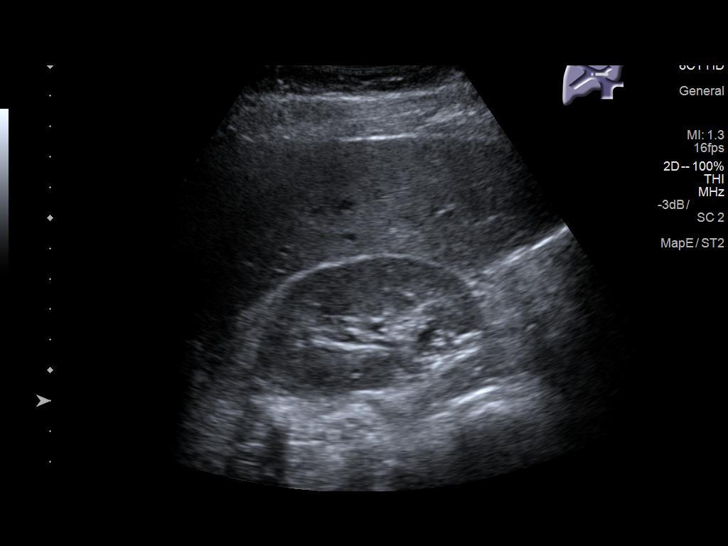
[im 14/31]
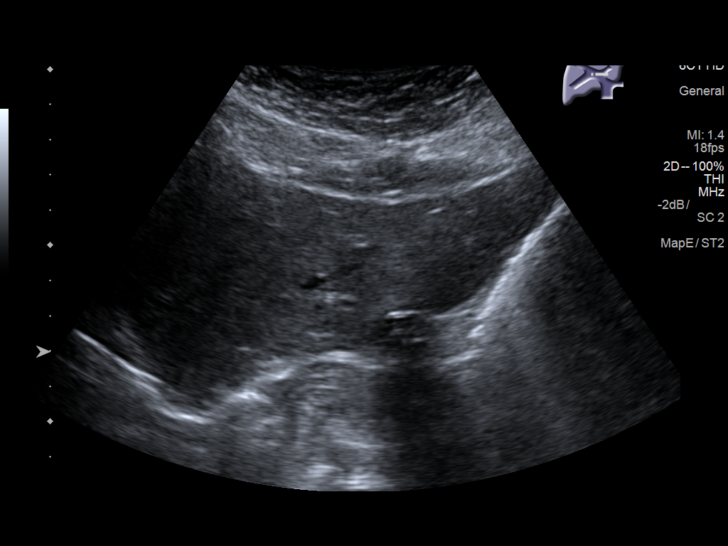
[im 17/31]
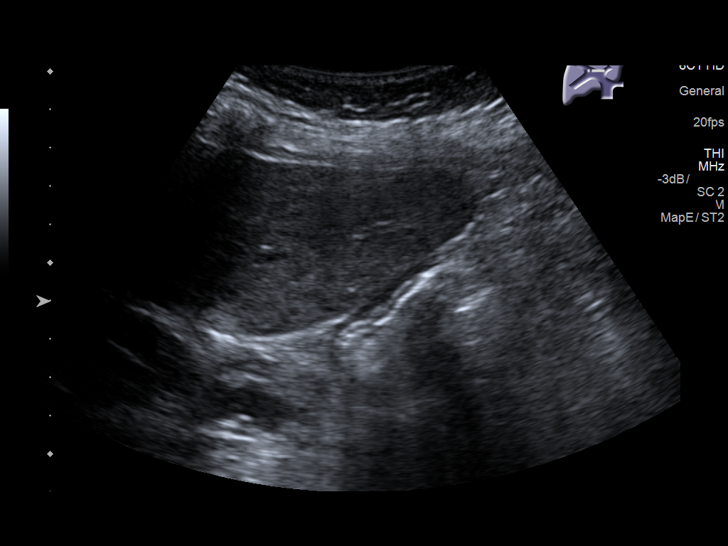
[im 19/31]
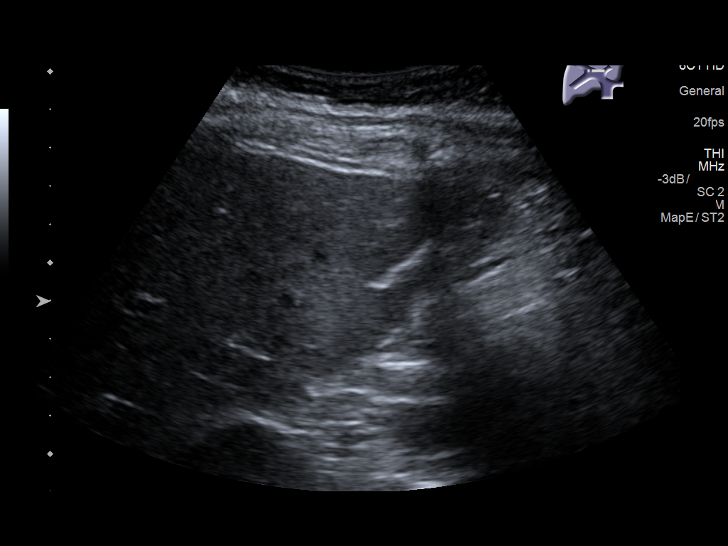
[im 21/31]
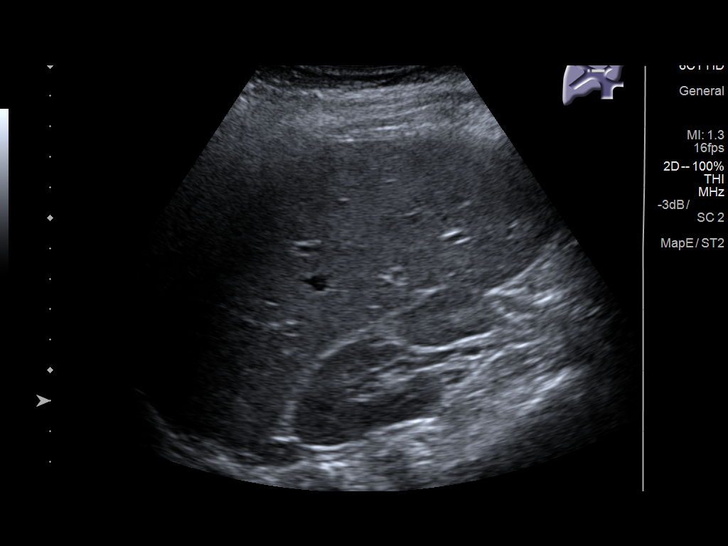
[im 23/31]
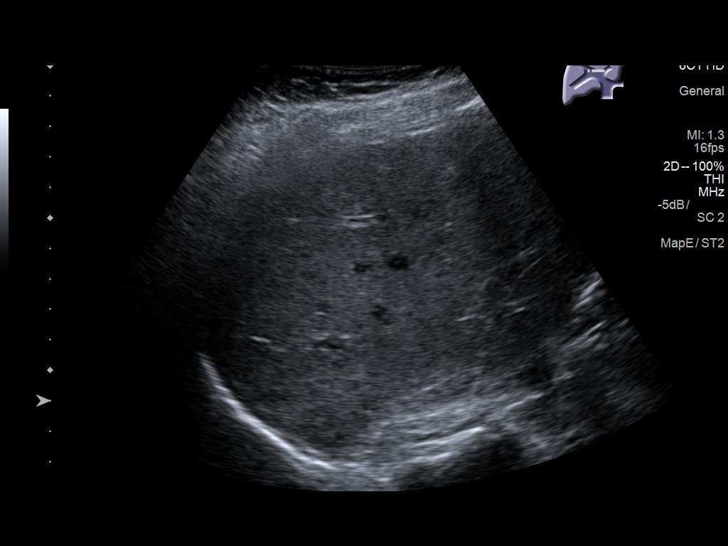
[im 26/31]
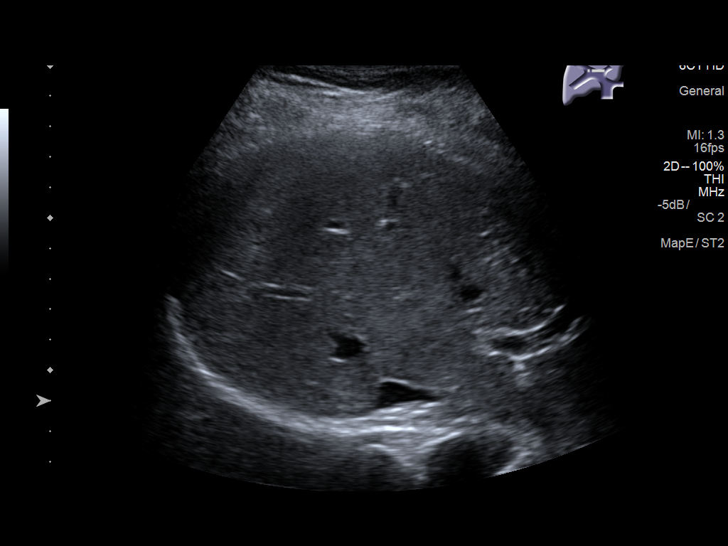
[im 28/31]
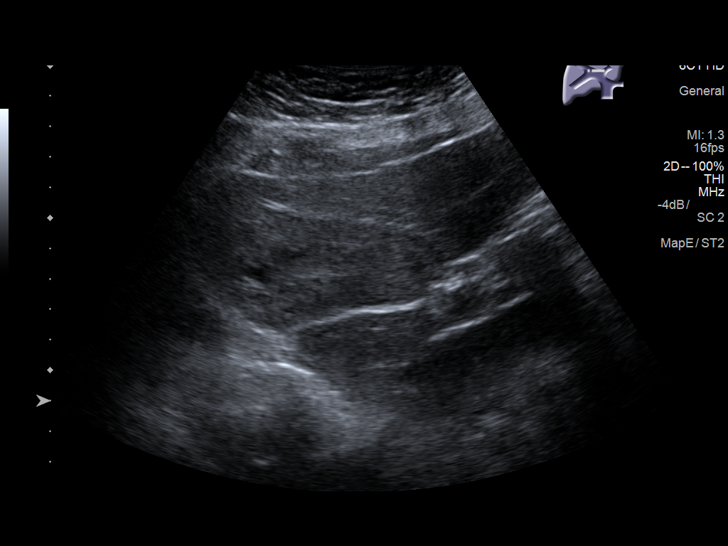
[im 31/31]
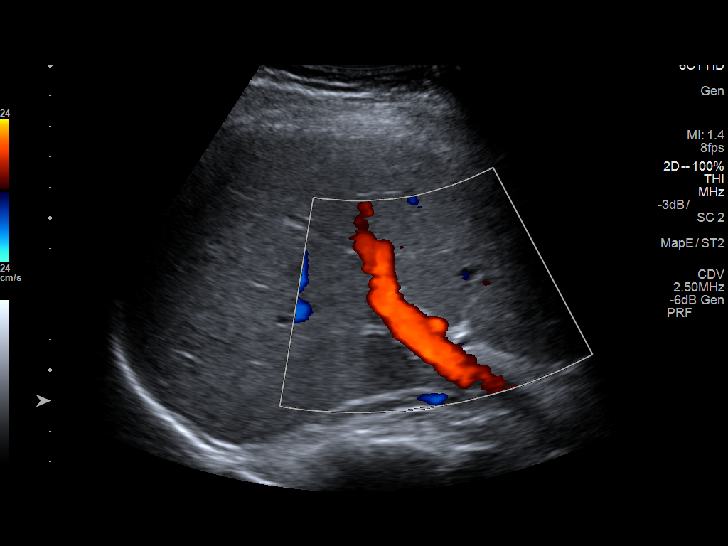

[14 of 25 positions shown; findings below may reference images not displayed]

FINDINGS: Gallbladder:

Status post cholecystectomy.  No retained stones seen.

Common bile duct:

Diameter: 0.2 cm, within normal limits in caliber.

Liver:

No focal lesion identified. Within normal limits in parenchymal
echogenicity.
IMPRESSION: Status post cholecystectomy. No acute abnormality at the right upper
quadrant.

## 2020-08-14 ENCOUNTER — Institutional Professional Consult (permissible substitution): Payer: 59 | Admitting: Plastic Surgery

## 2020-09-11 ENCOUNTER — Other Ambulatory Visit: Payer: Self-pay

## 2020-09-11 ENCOUNTER — Encounter: Payer: Self-pay | Admitting: Plastic Surgery

## 2020-09-11 ENCOUNTER — Ambulatory Visit (INDEPENDENT_AMBULATORY_CARE_PROVIDER_SITE_OTHER): Payer: Managed Care, Other (non HMO) | Admitting: Plastic Surgery

## 2020-09-11 VITALS — BP 129/86 | HR 73 | Temp 98.2°F | Ht 66.0 in | Wt 208.2 lb

## 2020-09-11 DIAGNOSIS — L989 Disorder of the skin and subcutaneous tissue, unspecified: Secondary | ICD-10-CM

## 2020-09-11 DIAGNOSIS — L910 Hypertrophic scar: Secondary | ICD-10-CM

## 2020-09-11 NOTE — Progress Notes (Signed)
Referring Provider Boneta Lucks, NP [address] [facility] [address] [place],  [place] [address]   CC:  Chief Complaint  Patient presents with  . Advice Only      Sarah Beck is an 44 y.o. female.  HPI: Patient presents to discuss a number of skin issues.  She has multiple moles on her face that she believes are genetic.  Is been present for a number of years and are pigmented.  She feels like there is a few more present now than were present some years ago.  They bother her and some of them are raised.  She would like to see if some of them can be removed.  She believes that some were removed in the past but is uncertain of exactly how.  She also has keloids on her abdomen that are intermittently painful and 1 in particular on the right side that she would like addressed if possible.  These were from laparoscopic surgery port sites.  No Known Allergies  Outpatient Encounter Medications as of 09/11/2020  Medication Sig  . aspirin-acetaminophen-caffeine (EXCEDRIN MIGRAINE) 250-250-65 MG tablet Take by mouth every 6 (six) hours as needed for headache.  Marland Kitchen LORazepam (ATIVAN) 1 MG tablet Take 1 mg by mouth daily as needed for anxiety.  . Multiple Vitamins-Minerals (MULTIVITAMIN WITH MINERALS) tablet Take 1 tablet by mouth daily.  . polyethylene glycol powder (GLYCOLAX/MIRALAX) powder Take 255 g by mouth daily.  Marland Kitchen terbinafine (LAMISIL) 250 MG tablet Take 250 mg by mouth daily.  . [DISCONTINUED] docusate sodium 100 MG CAPS Take 100 mg by mouth 2 (two) times daily as needed for mild constipation or moderate constipation.  . [DISCONTINUED] HYDROcodone-acetaminophen (NORCO/VICODIN) 5-325 MG per tablet Take 1-2 tablets by mouth every 6 (six) hours as needed for moderate pain or severe pain.  . [DISCONTINUED] hyoscyamine (LEVSIN SL) 0.125 MG SL tablet Place 1 tablet (0.125 mg total) under the tongue every 4 (four) hours as needed.  . [DISCONTINUED] ondansetron (ZOFRAN) 4 MG tablet Take 1 tablet (4 mg total)  by mouth every 6 (six) hours.   No facility-administered encounter medications on file as of 09/11/2020.     Past Medical History:  Diagnosis Date  . Hx of cholecystectomy 2015  . LAP-BAND surgery status 2010    Past Surgical History:  Procedure Laterality Date  . CHOLECYSTECTOMY N/A 11/10/2014   Procedure: LAPAROSCOPIC CHOLECYSTECTOMY WITH INTRAOPERATIVE CHOLANGIOGRAM;  Surgeon: Harriette Bouillon, MD;  Location: [facility] OR;  Service: General;  Laterality: N/A;  . lapband      Family History  Problem Relation Age of Onset  . Diabetes Mother   . Heart disease Mother   . Colon polyps Mother   . Diabetes Father   . Heart disease Father   . Heart disease Maternal Grandmother   . Alzheimer's disease Maternal Grandmother   . Colon cancer Neg Hx     Social History   Social History Narrative  . Not on file     Review of Systems General: Denies fevers, chills, weight loss CV: Denies chest pain, shortness of breath, palpitations  Physical Exam Vitals with BMI 09/11/2020 05/12/2017 02/10/2017  Height 5\' 6"  5\' 5"  5\' 6"   Weight 208 lbs 3 oz 207 lbs 6 oz 207 lbs  BMI 33.62 34.6 33.5  Systolic 129 130 -  Diastolic 86 94 -  Pulse 73 88 -    General:  No acute distress,  Alert and oriented, Non-Toxic, Normal speech and affect Examination of the face shows multiple 1  to 2 mm pigmented moles primarily on the cheeks with a few on her neck on the right side.  They all have well-defined borders.  On her abdomen in the upper and lateral aspects there is several raised keloid scars ranging from 2 cm to 4 cm in length.  Assessment/Plan Patient presents with benign-appearing nevi on the face along with keloid scars in the abdomen.  We discussed doing a shave excision of one of the moles on her neck to ensure that she does not keloid in that location and if it goes well we can follow that up with further excisions.  At the same time I could inject the abdomen keloid that is bothering her the most with  a steroid injection.  We discussed the risks include bleeding, infection, damage surrounding structures, need for additional procedures.  She is fully aware and wants to proceed.  Sarah Beck 09/11/2020, 10:02 AM

## 2020-10-30 ENCOUNTER — Ambulatory Visit: Payer: Managed Care, Other (non HMO) | Admitting: Plastic Surgery

## 2020-11-06 ENCOUNTER — Ambulatory Visit: Payer: Managed Care, Other (non HMO) | Admitting: Plastic Surgery

## 2020-11-13 ENCOUNTER — Ambulatory Visit: Payer: Managed Care, Other (non HMO) | Admitting: Plastic Surgery

## 2021-04-14 ENCOUNTER — Other Ambulatory Visit: Payer: Self-pay

## 2021-04-14 ENCOUNTER — Encounter: Payer: Self-pay | Admitting: Plastic Surgery

## 2021-04-14 ENCOUNTER — Ambulatory Visit (INDEPENDENT_AMBULATORY_CARE_PROVIDER_SITE_OTHER): Payer: Managed Care, Other (non HMO) | Admitting: Plastic Surgery

## 2021-04-14 DIAGNOSIS — L910 Hypertrophic scar: Secondary | ICD-10-CM | POA: Diagnosis not present

## 2021-04-14 NOTE — Progress Notes (Signed)
Patient ID: Sarah Beck, female    DOB: 1976-02-07, 45 y.o.   MRN: [id number]   Chief Complaint  Patient presents with  . Advice Only  . Skin Problem    The patient is a 45 year old female here for evaluation of her abdominal scars.  She had a lap band surgery in 2010 and a cholecystectomy in 2018.  She is 5 feet 5 inches tall and weighs 203 pounds.  She has noticed in the past couple of years of hypertrophy of her scars.  Fortunately most of them are hypertrophic.  There are 5 altogether.  Four of them are hypertrophic and one of them may be headed towards developing into a keloid.  That one is on the right side.  Band is palpable on the left abdominal side.  There is no sign of infection.  No sign of foreign body.  She has not had any intervention to date.   Review of Systems  Constitutional: Negative.   HENT: Negative.   Eyes: Negative.   Respiratory: Negative.   Cardiovascular: Negative.   Gastrointestinal: Negative.   Endocrine: Negative.   Genitourinary: Negative.   Musculoskeletal: Negative.   Skin: Positive for color change.  Psychiatric/Behavioral: Negative.     Past Medical History:  Diagnosis Date  . Hx of cholecystectomy 2015  . LAP-BAND surgery status 2010    Past Surgical History:  Procedure Laterality Date  . CHOLECYSTECTOMY N/A 11/10/2014   Procedure: LAPAROSCOPIC CHOLECYSTECTOMY WITH INTRAOPERATIVE CHOLANGIOGRAM;  Surgeon: Harriette Bouillon, MD;  Location: [facility] OR;  Service: General;  Laterality: N/A;  . lapband        Current Outpatient Medications:  .  aspirin-acetaminophen-caffeine (EXCEDRIN MIGRAINE) 250-250-65 MG tablet, Take by mouth every 6 (six) hours as needed for headache., Disp: , Rfl:  .  LORazepam (ATIVAN) 1 MG tablet, Take 1 mg by mouth daily as needed for anxiety., Disp: , Rfl:  .  Multiple Vitamins-Minerals (MULTIVITAMIN WITH MINERALS) tablet, Take 1 tablet by mouth daily., Disp: , Rfl:  .  polyethylene glycol powder (GLYCOLAX/MIRALAX)  powder, Take 255 g by mouth daily., Disp: 17 g, Rfl: 2 .  terbinafine (LAMISIL) 250 MG tablet, Take 250 mg by mouth daily., Disp: , Rfl:    Objective:   Vitals:   04/14/21 1304  BP: 131/86  Pulse: 73  SpO2: 99%    Physical Exam Vitals and nursing note reviewed.  Constitutional:      Appearance: Normal appearance.  HENT:     Head: Normocephalic and atraumatic.  Cardiovascular:     Rate and Rhythm: Normal rate.     Pulses: Normal pulses.  Pulmonary:     Effort: Pulmonary effort is normal.  Abdominal:     General: Abdomen is flat. There is no distension.     Palpations: There is no mass.     Tenderness: There is no abdominal tenderness. There is no guarding.  Skin:    General: Skin is warm.     Coloration: Skin is not jaundiced.     Findings: No bruising.  Neurological:     General: No focal deficit present.     Mental Status: She is alert. She is disoriented.  Psychiatric:        Mood and Affect: Mood normal.        Behavior: Behavior normal.        Thought Content: Thought content normal.        Judgment: Judgment normal.     Assessment & Plan:  Hypertrophic scar of skin  Recommend multimodality treatment with Kenalog injection into the hypertrophic and keloid scars.  She should start massaging.  She can use skinuva and silicone sheets.  The last resort would be excision.  She also asked about liposuction and may be an abdominoplasty.  I think that she should decrease her overall weight at least 20 pounds prior to intervention.  This was given to the best overall aesthetic result.  We can also make a referral to the healthy weight and wellness center.  The patient is in agreement.  Pictures were obtained of the patient and placed in the chart with the patient's or guardian's permission.   Sarah Bills Alaija Ruble, DO

## 2021-06-26 ENCOUNTER — Ambulatory Visit: Payer: Managed Care, Other (non HMO) | Admitting: Plastic Surgery

## 2021-07-12 ENCOUNTER — Other Ambulatory Visit: Payer: Self-pay

## 2021-07-12 ENCOUNTER — Encounter (HOSPITAL_COMMUNITY): Payer: Self-pay | Admitting: *Deleted

## 2021-07-12 ENCOUNTER — Ambulatory Visit (HOSPITAL_COMMUNITY)
Admission: EM | Admit: 2021-07-12 | Discharge: 2021-07-12 | Disposition: A | Discharge status: Home / Self Care | Payer: Managed Care, Other (non HMO) | Attending: Physician Assistant | Admitting: Physician Assistant

## 2021-07-12 DIAGNOSIS — R030 Elevated blood-pressure reading, without diagnosis of hypertension: Secondary | ICD-10-CM | POA: Diagnosis not present

## 2021-07-12 DIAGNOSIS — R0789 Other chest pain: Secondary | ICD-10-CM

## 2021-07-12 MED ORDER — AMLODIPINE BESYLATE 5 MG PO TABS: 5.0000 mg | ORAL_TABLET | Freq: Every day | ORAL | 0 refills | Status: DC

## 2021-07-12 NOTE — ED Notes (Signed)
Info entered on wrong PT

## 2021-07-12 NOTE — ED Provider Notes (Signed)
[facility]    CSN: [id number] Arrival date & time: 07/12/21  1553      History   Chief Complaint Chief Complaint  Patient presents with   Hypertension    HPI Sarah Beck is a 45 y.o. female.   Patient presents today with a several week history of elevated blood pressure reading.  Reports that when she last went to her primary care provider she noticed that her blood pressure was borderline at 130/90 approximately.  She then obtained a blood pressure monitoring device and has been monitoring this at home with systolic between 010-932 and diastolic between 90 and 100.  She denies history of essential hypertension and has not taken antihypertensive medications in the past.  She does report increased caffeine consumption as she switched from half caffeine to regular caffeine.  She also reports she does not monitor her diet for salt.  She does not report any recent decongestant use.  She reports occasional chest pain which she describes as a pinching sensation that lasts for few seconds and then resolved without intervention.  This is not associated with activity.  She denies associated shortness of breath, radiation of pain, nausea, vomiting.  She denies history of cardiovascular condition.  She is not currently experiencing any pain.  Denies additional symptoms including headache, dizziness, vision changes, leg swelling.   Past Medical History:  Diagnosis Date   Hx of cholecystectomy 2015   LAP-BAND surgery status 2010    Patient Active Problem List   Diagnosis Date Noted   Hypertrophic scar of skin 04/14/2021   Acute calculous cholecystitis 11/10/2014    Past Surgical History:  Procedure Laterality Date   CHOLECYSTECTOMY N/A 11/10/2014   Procedure: LAPAROSCOPIC CHOLECYSTECTOMY WITH INTRAOPERATIVE CHOLANGIOGRAM;  Surgeon: Harriette Bouillon, MD;  Location: [facility] OR;  Service: General;  Laterality: N/A;   lapband      OB History     Gravida  1   Para      Term       Preterm      AB      Living         SAB      IAB      Ectopic      Multiple      Live Births               Home Medications    Prior to Admission medications   Medication Sig Start Date End Date Taking? Authorizing Provider  amLODipine (NORVASC) 5 MG tablet Take 1 tablet (5 mg total) by mouth daily. 07/12/21  Yes Tran Arzuaga, Noberto Retort, PA-C  aspirin-acetaminophen-caffeine (EXCEDRIN MIGRAINE) [phone] MG tablet Take by mouth every 6 (six) hours as needed for headache.    [provider]  LORazepam (ATIVAN) 1 MG tablet Take 1 mg by mouth daily as needed for anxiety.    [provider]  Multiple Vitamins-Minerals (MULTIVITAMIN WITH MINERALS) tablet Take 1 tablet by mouth daily.    [provider]    Family History Family History  Problem Relation Age of Onset   Diabetes Mother    Heart disease Mother    Colon polyps Mother    Diabetes Father    Heart disease Father    Heart disease Maternal Grandmother    Alzheimer's disease Maternal Grandmother    Colon cancer Neg Hx     Social History Social History   Tobacco Use   Smoking status: Never   Smokeless tobacco: Never  Vaping Use  Vaping Use: Never used  Substance Use Topics   Alcohol use: No   Drug use: No     Allergies   Patient has no known allergies.   Review of Systems Review of Systems  Constitutional:  Negative for activity change, appetite change, fatigue and fever.  Eyes:  Negative for visual disturbance.  Respiratory:  Negative for cough and shortness of breath.   Cardiovascular:  Positive for chest pain (pinching). Negative for palpitations and leg swelling.  Gastrointestinal:  Negative for abdominal pain, diarrhea, nausea and vomiting.  Neurological:  Negative for dizziness, light-headedness and headaches.    Physical Exam Triage Vital Signs ED Triage Vitals  Enc Vitals Group     BP 07/12/21 1754 (!) 147/88     Pulse Rate 07/12/21 1754 86     Resp 07/12/21  1754 18     Temp 07/12/21 1754 98.5 F (36.9 C)     Temp Source 07/12/21 1754 Oral     SpO2 07/12/21 1754 97 %     Weight --      Height --      Head Circumference --      Peak Flow --      Pain Score 07/12/21 1652 5     Pain Loc --      Pain Edu? --      Excl. in GC? --    No data found.  Updated Vital Signs BP (!) 147/88 (BP Location: Right Arm)   Pulse 86   Temp 98.5 F (36.9 C) (Oral)   Resp 18   LMP  (Within Weeks) Comment: 3 weeks  SpO2 97%   Breastfeeding Unknown   Visual Acuity Right Eye Distance:   Left Eye Distance:   Bilateral Distance:    Right Eye Near:   Left Eye Near:    Bilateral Near:     Physical Exam Vitals reviewed.  Constitutional:      General: She is awake. She is not in acute distress.    Appearance: Normal appearance. She is normal weight. She is not ill-appearing.     Comments: Very pleasant female appears stated age in no acute distress  HENT:     Head: Normocephalic and atraumatic.  Cardiovascular:     Rate and Rhythm: Normal rate and regular rhythm.     Heart sounds: Normal heart sounds, S1 normal and S2 normal. No murmur heard. Pulmonary:     Effort: Pulmonary effort is normal.     Breath sounds: Normal breath sounds. No wheezing, rhonchi or rales.     Comments: Clear auscultation bilaterally Musculoskeletal:     Right lower leg: No edema.     Left lower leg: No edema.  Psychiatric:        Behavior: Behavior is cooperative.     UC Treatments / Results  Labs (all labs ordered are listed, but only abnormal results are displayed) Labs Reviewed - No data to display  EKG   Radiology No results found.  Procedures Procedures (including critical care time)  Medications Ordered in UC Medications - No data to display  Initial Impression / Assessment and Plan / UC Course  I have reviewed the triage vital signs and the nursing notes.  Pertinent labs & imaging results that were available during my care of the patient were  reviewed by me and considered in my medical decision making (see chart for details).      EKG obtained showed normal sinus rhythm with a ventricular rate of 71  bpm without ischemic changes and similar to 09/02/2011 tracing.  Patient's pressure was elevated today and has had several elevated readings at home.  Discussed potential utility of starting antihypertensive medication.  Patient is hesitant to do this and will start with lifestyle changes including limiting caffeine and sodium consumption.  She will monitor her blood pressure regularly and if this remains elevated start amlodipine 5 mg which was prescribed today.  Recommend she follow-up with either our clinic or her primary care provider within 1 week for blood pressure recheck.  Discussed that she will also need lab work including CBC and CMP at this visit.  Discussed alarm symptoms that warrant emergent evaluation.  Strict return precautions given to which patient expressed understanding.  Final Clinical Impressions(s) / UC Diagnoses   Final diagnoses:  Elevated blood pressure reading  Chest discomfort     Discharge Instructions      Continue monitoring her blood pressure.  If this is persistently above 130/85 please start amlodipine as we discussed.  Monitor your diet for sodium and try to limit sodium consumption to 1800 mg or less per day.  Limit caffeine and avoid decongestants.  Follow-up with your primary care provider or our clinic within 1 week of starting blood pressure medication for monitoring and medication adjustment.  If you have any recurrent chest pain, shortness of breath, leg swelling, heart racing, vision changes, headache especially in the setting of elevated blood pressure you need to go to the emergency room.     ED Prescriptions     Medication Sig Dispense Auth. Provider   amLODipine (NORVASC) 5 MG tablet Take 1 tablet (5 mg total) by mouth daily. 10 tablet Marlin Brys, Noberto Retort, PA-C      PDMP not reviewed this  encounter.   Jeani Hawking, PA-C 07/12/21 1845

## 2021-07-12 NOTE — Discharge Instructions (Addendum)
Continue monitoring her blood pressure.  If this is persistently above 130/85 please start amlodipine as we discussed.  Monitor your diet for sodium and try to limit sodium consumption to 1800 mg or less per day.  Limit caffeine and avoid decongestants.  Follow-up with your primary care provider or our clinic within 1 week of starting blood pressure medication for monitoring and medication adjustment.  If you have any recurrent chest pain, shortness of breath, leg swelling, heart racing, vision changes, headache especially in the setting of elevated blood pressure you need to go to the emergency room.

## 2021-07-12 NOTE — ED Triage Notes (Signed)
Pt reports her blood pressure has being high x 1 week. States she went to the PCP 5 days ago. Pt reports "little pinch" left sided chest ion and off x 2 week. Aspirin gives relief. Denies vision changes, shortness of breath, dizziness, nausea, vomiting, abdominal pain, diarrhea.

## 2021-07-14 ENCOUNTER — Ambulatory Visit: Payer: Managed Care, Other (non HMO) | Admitting: Plastic Surgery

## 2021-09-07 NOTE — Progress Notes (Signed)
Referring Provider Sarah Lucks, NP [address] [facility] [address] [place],  [place] [address]   CC: No chief complaint on file.     Sarah Beck is an 45 y.o. female.  HPI: Patient is a 45 year old female with PMH significant for hypertrophic abdominal scars in context of cholecystectomy 2018 and laparoscopic band surgery 2010.  She was seen most recently here in the office on 04/14/2021 by Dr. Ulice Bold.  She noted that 4 out of the 5 scars were hypertrophic and that the scar on the right side is draining towards keloid formation.  Recommendation was for Kenalog injections, massaging the scars, as well as using Skinuva and silicone sheets.  She also recommended weight loss and referred her to a healthy weight and wellness center.  Pictures were obtained.  She presents today to clinic for Kenalog injections.  On my exam, patient endorses 40 pound weight loss over the course the past year.  She admits that she is not massaging the scars as often as she probably should be, but otherwise is prepared for Kenalog injection here in clinic today.  She is also inquiring about [place] as well as liposuction.  She denies any new or recent surgeries.   No Known Allergies  Outpatient Encounter Medications as of 09/08/2021  Medication Sig   amLODipine (NORVASC) 5 MG tablet Take 1 tablet (5 mg total) by mouth daily.   aspirin-acetaminophen-caffeine (EXCEDRIN MIGRAINE) 250-250-65 MG tablet Take by mouth every 6 (six) hours as needed for headache.   LORazepam (ATIVAN) 1 MG tablet Take 1 mg by mouth daily as needed for anxiety.   Multiple Vitamins-Minerals (MULTIVITAMIN WITH MINERALS) tablet Take 1 tablet by mouth daily.   No facility-administered encounter medications on file as of 09/08/2021.     Past Medical History:  Diagnosis Date   Hx of cholecystectomy 2015   LAP-BAND surgery status 2010    Past Surgical History:  Procedure Laterality Date   CHOLECYSTECTOMY N/A 11/10/2014   Procedure:  LAPAROSCOPIC CHOLECYSTECTOMY WITH INTRAOPERATIVE CHOLANGIOGRAM;  Surgeon: Harriette Bouillon, MD;  Location: [facility];  Service: General;  Laterality: N/A;   lapband      Family History  Problem Relation Age of Onset   Diabetes Mother    Heart disease Mother    Colon polyps Mother    Diabetes Father    Heart disease Father    Heart disease Maternal Grandmother    Alzheimer's disease Maternal Grandmother    Colon cancer Neg Hx     Social History   Social History Narrative   Not on file     Review of Systems General: Denies fevers or chills Cardio: Denies chest pain Pulmonary: Denies difficulty breathing  Physical Exam Vitals with BMI 07/12/2021 04/14/2021 09/11/2020  Height - 5\' 5"  5\' 6"   Weight - 203 lbs 10 oz 208 lbs 3 oz  BMI - 33.88 33.62  Systolic 147 131  Diastolic 88 86 86  Pulse 86 73 73    General:  No acute distress, nontoxic appearing  Respiratory: No increased work of breathing Neuro: Alert and oriented Psychiatric: Normal mood and affect  Skin: 5 surgical incisions that are hypertrophic.  The one on most lateral right side of abdomen appears most like keloid scar.  Assessment/Plan  Administered 0.3 Kenalog and 0.1 lidocaine into most lateral right abdominal scar.  Patient tolerated procedure albeit with discomfort.  No immediate complications.  She discussed cost of liposuction with and purchased [place] which she will apply daily to  her scars.  Patient can follow-up in clinic in a couple of months for ongoing evaluation and management.  Evelena Leyden 09/07/2021, 9:59 AM

## 2021-09-08 ENCOUNTER — Other Ambulatory Visit: Payer: Self-pay

## 2021-09-08 ENCOUNTER — Ambulatory Visit (INDEPENDENT_AMBULATORY_CARE_PROVIDER_SITE_OTHER): Payer: Managed Care, Other (non HMO) | Admitting: Physician Assistant

## 2021-09-08 DIAGNOSIS — Z719 Counseling, unspecified: Secondary | ICD-10-CM

## 2021-09-08 DIAGNOSIS — L910 Hypertrophic scar: Secondary | ICD-10-CM | POA: Diagnosis not present

## 2021-11-10 ENCOUNTER — Ambulatory Visit: Payer: Managed Care, Other (non HMO) | Admitting: Physician Assistant
